# Patient Record
Sex: Male | Born: 1945 | Hispanic: No | Marital: Single
Health system: Southern US, Community
[De-identification: ages and names within clinical notes are randomized; demographics above are authoritative.]

---

## 2017-10-30 DIAGNOSIS — C160 Malignant neoplasm of cardia: Secondary | ICD-10-CM

## 2017-10-30 DIAGNOSIS — F17210 Nicotine dependence, cigarettes, uncomplicated: Secondary | ICD-10-CM

## 2017-10-30 DIAGNOSIS — Z7289 Other problems related to lifestyle: Secondary | ICD-10-CM | POA: Diagnosis not present

## 2017-10-30 DIAGNOSIS — R634 Abnormal weight loss: Secondary | ICD-10-CM | POA: Diagnosis not present

## 2017-11-03 DIAGNOSIS — R918 Other nonspecific abnormal finding of lung field: Secondary | ICD-10-CM | POA: Diagnosis not present

## 2017-11-03 DIAGNOSIS — C160 Malignant neoplasm of cardia: Secondary | ICD-10-CM

## 2017-11-03 DIAGNOSIS — F17210 Nicotine dependence, cigarettes, uncomplicated: Secondary | ICD-10-CM | POA: Diagnosis not present

## 2017-11-20 DIAGNOSIS — C160 Malignant neoplasm of cardia: Secondary | ICD-10-CM | POA: Diagnosis not present

## 2017-12-25 DIAGNOSIS — C160 Malignant neoplasm of cardia: Secondary | ICD-10-CM | POA: Diagnosis not present

## 2018-01-08 DIAGNOSIS — C160 Malignant neoplasm of cardia: Secondary | ICD-10-CM | POA: Diagnosis not present

## 2018-01-22 DIAGNOSIS — R05 Cough: Secondary | ICD-10-CM

## 2018-01-22 DIAGNOSIS — C160 Malignant neoplasm of cardia: Secondary | ICD-10-CM

## 2018-02-05 DIAGNOSIS — R05 Cough: Secondary | ICD-10-CM

## 2018-02-05 DIAGNOSIS — C160 Malignant neoplasm of cardia: Secondary | ICD-10-CM

## 2018-02-19 DIAGNOSIS — C160 Malignant neoplasm of cardia: Secondary | ICD-10-CM

## 2018-02-19 DIAGNOSIS — E876 Hypokalemia: Secondary | ICD-10-CM

## 2018-02-19 DIAGNOSIS — D701 Agranulocytosis secondary to cancer chemotherapy: Secondary | ICD-10-CM

## 2018-02-19 DIAGNOSIS — R05 Cough: Secondary | ICD-10-CM | POA: Diagnosis not present

## 2018-03-18 DIAGNOSIS — S72002A Fracture of unspecified part of neck of left femur, initial encounter for closed fracture: Secondary | ICD-10-CM

## 2018-03-18 DIAGNOSIS — W19XXXA Unspecified fall, initial encounter: Secondary | ICD-10-CM

## 2018-03-19 DIAGNOSIS — W19XXXA Unspecified fall, initial encounter: Secondary | ICD-10-CM | POA: Diagnosis not present

## 2018-03-19 DIAGNOSIS — S72002A Fracture of unspecified part of neck of left femur, initial encounter for closed fracture: Secondary | ICD-10-CM | POA: Diagnosis not present

## 2018-03-20 DIAGNOSIS — S72002A Fracture of unspecified part of neck of left femur, initial encounter for closed fracture: Secondary | ICD-10-CM | POA: Diagnosis not present

## 2018-03-20 DIAGNOSIS — W19XXXA Unspecified fall, initial encounter: Secondary | ICD-10-CM | POA: Diagnosis not present

## 2018-03-21 DIAGNOSIS — W19XXXA Unspecified fall, initial encounter: Secondary | ICD-10-CM | POA: Diagnosis not present

## 2018-03-21 DIAGNOSIS — S72002A Fracture of unspecified part of neck of left femur, initial encounter for closed fracture: Secondary | ICD-10-CM | POA: Diagnosis not present

## 2018-03-22 DIAGNOSIS — S72002A Fracture of unspecified part of neck of left femur, initial encounter for closed fracture: Secondary | ICD-10-CM | POA: Diagnosis not present

## 2018-03-22 DIAGNOSIS — W19XXXA Unspecified fall, initial encounter: Secondary | ICD-10-CM | POA: Diagnosis not present

## 2018-03-24 ENCOUNTER — Inpatient Hospital Stay (HOSPITAL_COMMUNITY): Payer: Medicare Other

## 2018-03-24 ENCOUNTER — Inpatient Hospital Stay (HOSPITAL_COMMUNITY)
Admission: AD | Admit: 2018-03-24 | Discharge: 2018-03-28 | DRG: 208 | Disposition: E | Payer: Medicare Other | Source: Other Acute Inpatient Hospital | Attending: Internal Medicine | Admitting: Internal Medicine

## 2018-03-24 DIAGNOSIS — Z681 Body mass index (BMI) 19 or less, adult: Secondary | ICD-10-CM | POA: Diagnosis not present

## 2018-03-24 DIAGNOSIS — Z9289 Personal history of other medical treatment: Secondary | ICD-10-CM

## 2018-03-24 DIAGNOSIS — Z9119 Patient's noncompliance with other medical treatment and regimen: Secondary | ICD-10-CM

## 2018-03-24 DIAGNOSIS — Z79899 Other long term (current) drug therapy: Secondary | ICD-10-CM | POA: Diagnosis not present

## 2018-03-24 DIAGNOSIS — J150 Pneumonia due to Klebsiella pneumoniae: Secondary | ICD-10-CM

## 2018-03-24 DIAGNOSIS — F17200 Nicotine dependence, unspecified, uncomplicated: Secondary | ICD-10-CM | POA: Diagnosis not present

## 2018-03-24 DIAGNOSIS — J9601 Acute respiratory failure with hypoxia: Secondary | ICD-10-CM | POA: Diagnosis present

## 2018-03-24 DIAGNOSIS — B441 Other pulmonary aspergillosis: Secondary | ICD-10-CM | POA: Diagnosis present

## 2018-03-24 DIAGNOSIS — R0603 Acute respiratory distress: Secondary | ICD-10-CM | POA: Diagnosis not present

## 2018-03-24 DIAGNOSIS — G9340 Encephalopathy, unspecified: Secondary | ICD-10-CM | POA: Diagnosis present

## 2018-03-24 DIAGNOSIS — I272 Pulmonary hypertension, unspecified: Secondary | ICD-10-CM | POA: Diagnosis present

## 2018-03-24 DIAGNOSIS — Z978 Presence of other specified devices: Secondary | ICD-10-CM

## 2018-03-24 DIAGNOSIS — J9600 Acute respiratory failure, unspecified whether with hypoxia or hypercapnia: Secondary | ICD-10-CM | POA: Diagnosis present

## 2018-03-24 DIAGNOSIS — Z833 Family history of diabetes mellitus: Secondary | ICD-10-CM | POA: Diagnosis not present

## 2018-03-24 DIAGNOSIS — R627 Adult failure to thrive: Secondary | ICD-10-CM | POA: Diagnosis present

## 2018-03-24 DIAGNOSIS — W19XXXD Unspecified fall, subsequent encounter: Secondary | ICD-10-CM | POA: Diagnosis present

## 2018-03-24 DIAGNOSIS — R34 Anuria and oliguria: Secondary | ICD-10-CM | POA: Diagnosis present

## 2018-03-24 DIAGNOSIS — E43 Unspecified severe protein-calorie malnutrition: Secondary | ICD-10-CM | POA: Diagnosis present

## 2018-03-24 DIAGNOSIS — B20 Human immunodeficiency virus [HIV] disease: Secondary | ICD-10-CM | POA: Diagnosis present

## 2018-03-24 DIAGNOSIS — R64 Cachexia: Secondary | ICD-10-CM | POA: Diagnosis present

## 2018-03-24 DIAGNOSIS — Z9911 Dependence on respirator [ventilator] status: Secondary | ICD-10-CM | POA: Diagnosis not present

## 2018-03-24 DIAGNOSIS — Z7189 Other specified counseling: Secondary | ICD-10-CM | POA: Diagnosis not present

## 2018-03-24 DIAGNOSIS — J449 Chronic obstructive pulmonary disease, unspecified: Secondary | ICD-10-CM | POA: Diagnosis present

## 2018-03-24 DIAGNOSIS — Z9221 Personal history of antineoplastic chemotherapy: Secondary | ICD-10-CM

## 2018-03-24 DIAGNOSIS — Z9981 Dependence on supplemental oxygen: Secondary | ICD-10-CM | POA: Diagnosis not present

## 2018-03-24 DIAGNOSIS — E875 Hyperkalemia: Secondary | ICD-10-CM | POA: Diagnosis present

## 2018-03-24 DIAGNOSIS — A419 Sepsis, unspecified organism: Secondary | ICD-10-CM | POA: Diagnosis not present

## 2018-03-24 DIAGNOSIS — K0889 Other specified disorders of teeth and supporting structures: Secondary | ICD-10-CM | POA: Diagnosis not present

## 2018-03-24 DIAGNOSIS — C169 Malignant neoplasm of stomach, unspecified: Secondary | ICD-10-CM | POA: Diagnosis present

## 2018-03-24 DIAGNOSIS — Z9114 Patient's other noncompliance with medication regimen: Secondary | ICD-10-CM | POA: Diagnosis not present

## 2018-03-24 DIAGNOSIS — R6521 Severe sepsis with septic shock: Secondary | ICD-10-CM | POA: Diagnosis not present

## 2018-03-24 DIAGNOSIS — I959 Hypotension, unspecified: Secondary | ICD-10-CM | POA: Diagnosis not present

## 2018-03-24 DIAGNOSIS — D72829 Elevated white blood cell count, unspecified: Secondary | ICD-10-CM

## 2018-03-24 DIAGNOSIS — Z515 Encounter for palliative care: Secondary | ICD-10-CM | POA: Diagnosis not present

## 2018-03-24 DIAGNOSIS — S72002D Fracture of unspecified part of neck of left femur, subsequent encounter for closed fracture with routine healing: Secondary | ICD-10-CM

## 2018-03-24 DIAGNOSIS — X58XXXD Exposure to other specified factors, subsequent encounter: Secondary | ICD-10-CM | POA: Diagnosis not present

## 2018-03-24 DIAGNOSIS — N179 Acute kidney failure, unspecified: Secondary | ICD-10-CM | POA: Diagnosis not present

## 2018-03-24 DIAGNOSIS — R579 Shock, unspecified: Secondary | ICD-10-CM | POA: Diagnosis not present

## 2018-03-24 LAB — RESPIRATORY PANEL BY PCR
Adenovirus: NOT DETECTED
Bordetella pertussis: NOT DETECTED
CHLAMYDOPHILA PNEUMONIAE-RVPPCR: NOT DETECTED
CORONAVIRUS NL63-RVPPCR: NOT DETECTED
Coronavirus 229E: NOT DETECTED
Coronavirus HKU1: NOT DETECTED
Coronavirus OC43: NOT DETECTED
Influenza A: NOT DETECTED
Influenza B: NOT DETECTED
Metapneumovirus: NOT DETECTED
Mycoplasma pneumoniae: NOT DETECTED
PARAINFLUENZA VIRUS 3-RVPPCR: NOT DETECTED
Parainfluenza Virus 1: NOT DETECTED
Parainfluenza Virus 2: NOT DETECTED
Parainfluenza Virus 4: NOT DETECTED
Respiratory Syncytial Virus: NOT DETECTED
Rhinovirus / Enterovirus: NOT DETECTED

## 2018-03-24 LAB — COMPREHENSIVE METABOLIC PANEL
ALT: 24 U/L (ref 0–44)
AST: 38 U/L (ref 15–41)
Albumin: 2.5 g/dL — ABNORMAL LOW (ref 3.5–5.0)
Alkaline Phosphatase: 123 U/L (ref 38–126)
Anion gap: 9 (ref 5–15)
BUN: 60 mg/dL — ABNORMAL HIGH (ref 8–23)
CHLORIDE: 102 mmol/L (ref 98–111)
CO2: 28 mmol/L (ref 22–32)
Calcium: 10.2 mg/dL (ref 8.9–10.3)
Creatinine, Ser: 1.4 mg/dL — ABNORMAL HIGH (ref 0.61–1.24)
GFR calc Af Amer: 58 mL/min — ABNORMAL LOW (ref >60)
GFR calc non Af Amer: 50 mL/min — ABNORMAL LOW (ref >60)
Glucose, Bld: 189 mg/dL — ABNORMAL HIGH (ref 70–99)
Potassium: 6.2 mmol/L — ABNORMAL HIGH (ref 3.5–5.1)
Sodium: 139 mmol/L (ref 135–145)
Total Bilirubin: 1.4 mg/dL — ABNORMAL HIGH (ref 0.3–1.2)
Total Protein: 5.8 g/dL — ABNORMAL LOW (ref 6.5–8.1)

## 2018-03-24 LAB — CORTISOL: Cortisol, Plasma: 36.6 ug/dL

## 2018-03-24 LAB — URINALYSIS, ROUTINE W REFLEX MICROSCOPIC
Bilirubin Urine: NEGATIVE
Glucose, UA: NEGATIVE mg/dL
Ketones, ur: NEGATIVE mg/dL
Leukocytes, UA: NEGATIVE
Nitrite: NEGATIVE
PROTEIN: 30 mg/dL — AB
Specific Gravity, Urine: 1.033 — ABNORMAL HIGH (ref 1.005–1.030)
pH: 6 (ref 5.0–8.0)

## 2018-03-24 LAB — GLUCOSE, CAPILLARY
GLUCOSE-CAPILLARY: 164 mg/dL — AB (ref 70–99)
Glucose-Capillary: 166 mg/dL — ABNORMAL HIGH (ref 70–99)
Glucose-Capillary: 145 mg/dL — ABNORMAL HIGH (ref 70–99)

## 2018-03-24 LAB — CBC WITH DIFFERENTIAL/PLATELET
Abs Immature Granulocytes: 0.72 10*3/uL — ABNORMAL HIGH (ref 0.00–0.07)
BASOS ABS: 0.1 10*3/uL (ref 0.0–0.1)
Basophils Relative: 0 %
Eosinophils Absolute: 0 10*3/uL (ref 0.0–0.5)
Eosinophils Relative: 0 %
HCT: 33.9 % — ABNORMAL LOW (ref 39.0–52.0)
Hemoglobin: 10.3 g/dL — ABNORMAL LOW (ref 13.0–17.0)
Immature Granulocytes: 2 %
Lymphocytes Relative: 0 %
Lymphs Abs: 0 10*3/uL — ABNORMAL LOW (ref 0.7–4.0)
MCH: 33.7 pg (ref 26.0–34.0)
MCHC: 30.4 g/dL (ref 30.0–36.0)
MCV: 110.8 fL — ABNORMAL HIGH (ref 80.0–100.0)
Monocytes Absolute: 1.2 10*3/uL — ABNORMAL HIGH (ref 0.1–1.0)
Monocytes Relative: 3 %
NRBC: 0 % (ref 0.0–0.2)
Neutro Abs: 44.1 10*3/uL — ABNORMAL HIGH (ref 1.7–7.7)
Neutrophils Relative %: 95 %
Platelets: 291 10*3/uL (ref 150–400)
RBC: 3.06 MIL/uL — ABNORMAL LOW (ref 4.22–5.81)
RDW: 15.9 % — ABNORMAL HIGH (ref 11.5–15.5)
WBC MORPHOLOGY: INCREASED
WBC: 46.1 10*3/uL — ABNORMAL HIGH (ref 4.0–10.5)

## 2018-03-24 LAB — TRIGLYCERIDES: Triglycerides: 85 mg/dL (ref <150)

## 2018-03-24 LAB — POCT I-STAT 3, ART BLOOD GAS (G3+)
Acid-Base Excess: 4 mmol/L — ABNORMAL HIGH (ref 0.0–2.0)
Bicarbonate: 28.6 mmol/L — ABNORMAL HIGH (ref 20.0–28.0)
O2 Saturation: 90 %
Patient temperature: 98.6
TCO2: 30 mmol/L (ref 22–32)
pCO2 arterial: 43.2 mmHg (ref 32.0–48.0)
pH, Arterial: 7.43 (ref 7.350–7.450)
pO2, Arterial: 57 mmHg — ABNORMAL LOW (ref 83.0–108.0)

## 2018-03-24 LAB — APTT: aPTT: 25 seconds (ref 24–36)

## 2018-03-24 LAB — PROCALCITONIN: Procalcitonin: 31.18 ng/mL

## 2018-03-24 LAB — PHOSPHORUS: PHOSPHORUS: 4.9 mg/dL — AB (ref 2.5–4.6)

## 2018-03-24 LAB — LIPASE, BLOOD: Lipase: 20 U/L (ref 11–51)

## 2018-03-24 LAB — PROTIME-INR
INR: 0.94
Prothrombin Time: 12.5 seconds (ref 11.4–15.2)

## 2018-03-24 LAB — MRSA PCR SCREENING: MRSA by PCR: NEGATIVE

## 2018-03-24 LAB — BRAIN NATRIURETIC PEPTIDE: B Natriuretic Peptide: 400.5 pg/mL — ABNORMAL HIGH (ref 0.0–100.0)

## 2018-03-24 LAB — AMYLASE: Amylase: 138 U/L — ABNORMAL HIGH (ref 28–100)

## 2018-03-24 LAB — MAGNESIUM: Magnesium: 3.8 mg/dL — ABNORMAL HIGH (ref 1.7–2.4)

## 2018-03-24 LAB — TROPONIN I: TROPONIN I: 0.09 ng/mL — AB (ref <0.03)

## 2018-03-24 LAB — LACTIC ACID, PLASMA
Lactic Acid, Venous: 2.4 mmol/L (ref 0.5–1.9)
Lactic Acid, Venous: 2.7 mmol/L (ref 0.5–1.9)

## 2018-03-24 MED ORDER — INSULIN ASPART 100 UNIT/ML ~~LOC~~ SOLN
2.0000 [IU] | SUBCUTANEOUS | Status: DC
  Administered 2018-03-24: 2 [IU] via SUBCUTANEOUS
  Administered 2018-03-24: 4 [IU] via SUBCUTANEOUS
  Administered 2018-03-25: 2 [IU] via SUBCUTANEOUS

## 2018-03-24 MED ORDER — VANCOMYCIN HCL IN DEXTROSE 750-5 MG/150ML-% IV SOLN: 750.0000 mg | INTRAVENOUS | Status: DC

## 2018-03-24 MED ORDER — PIPERACILLIN-TAZOBACTAM 3.375 G IVPB
3.3750 g | Freq: Three times a day (TID) | INTRAVENOUS | Status: DC
  Administered 2018-03-25: 3.375 g via INTRAVENOUS
  Filled 2018-03-24 (×4): qty 50

## 2018-03-24 MED ORDER — PROPOFOL 1000 MG/100ML IV EMUL
0.0000 ug/kg/min | INTRAVENOUS | Status: DC
  Administered 2018-03-24: 5 ug/kg/min via INTRAVENOUS
  Administered 2018-03-25: 15 ug/kg/min via INTRAVENOUS
  Filled 2018-03-24 (×2): qty 100

## 2018-03-24 MED ORDER — MIDAZOLAM HCL 2 MG/2ML IJ SOLN: 2.0000 mg | INTRAMUSCULAR | Status: DC | PRN

## 2018-03-24 MED ORDER — FENTANYL CITRATE (PF) 100 MCG/2ML IJ SOLN
100.0000 ug | INTRAMUSCULAR | Status: DC | PRN
  Administered 2018-03-25: 25 ug via INTRAVENOUS
  Filled 2018-03-24 (×2): qty 2

## 2018-03-24 MED ORDER — DEXTROSE 50 % IV SOLN
1.0000 | Freq: Once | INTRAVENOUS | Status: AC
  Administered 2018-03-24: 50 mL via INTRAVENOUS
  Filled 2018-03-24: qty 50

## 2018-03-24 MED ORDER — VANCOMYCIN HCL IN DEXTROSE 1-5 GM/200ML-% IV SOLN
1000.0000 mg | Freq: Once | INTRAVENOUS | Status: AC
  Administered 2018-03-25: 1000 mg via INTRAVENOUS
  Filled 2018-03-24: qty 200

## 2018-03-24 MED ORDER — ORAL CARE MOUTH RINSE
15.0000 mL | OROMUCOSAL | Status: DC
  Administered 2018-03-24 – 2018-03-25 (×7): 15 mL via OROMUCOSAL

## 2018-03-24 MED ORDER — INSULIN ASPART 100 UNIT/ML IV SOLN
10.0000 [IU] | Freq: Once | INTRAVENOUS | Status: AC
  Administered 2018-03-24: 10 [IU] via INTRAVENOUS

## 2018-03-24 MED ORDER — DEXTROSE IN LACTATED RINGERS 5 % IV SOLN
INTRAVENOUS | Status: DC
  Administered 2018-03-24 – 2018-03-25 (×2): via INTRAVENOUS

## 2018-03-24 MED ORDER — PIPERACILLIN-TAZOBACTAM 3.375 G IVPB 30 MIN
3.3750 g | Freq: Once | INTRAVENOUS | Status: AC
  Administered 2018-03-25: 3.375 g via INTRAVENOUS
  Filled 2018-03-24: qty 50

## 2018-03-24 MED ORDER — VORICONAZOLE 200 MG IV SOLR
4.0000 mg/kg | Freq: Two times a day (BID) | INTRAVENOUS | Status: DC
  Administered 2018-03-25 (×2): 210 mg via INTRAVENOUS
  Filled 2018-03-24 (×3): qty 210

## 2018-03-24 MED ORDER — SODIUM CHLORIDE 0.9 % IV BOLUS
1000.0000 mL | Freq: Once | INTRAVENOUS | Status: AC
  Administered 2018-03-24: 1000 mL via INTRAVENOUS

## 2018-03-24 MED ORDER — VORICONAZOLE 200 MG IV SOLR
6.0000 mg/kg | Freq: Once | INTRAVENOUS | Status: DC
  Filled 2018-03-24: qty 320

## 2018-03-24 MED ORDER — CHLORHEXIDINE GLUCONATE 0.12% ORAL RINSE (MEDLINE KIT)
15.0000 mL | Freq: Two times a day (BID) | OROMUCOSAL | Status: DC
  Administered 2018-03-24 – 2018-03-25 (×2): 15 mL via OROMUCOSAL

## 2018-03-24 MED ORDER — FENTANYL CITRATE (PF) 100 MCG/2ML IJ SOLN
100.0000 ug | INTRAMUSCULAR | Status: DC | PRN
  Administered 2018-03-25: 100 ug via INTRAVENOUS

## 2018-03-24 MED ORDER — SODIUM POLYSTYRENE SULFONATE 15 GM/60ML PO SUSP
30.0000 g | Freq: Once | ORAL | Status: AC
  Administered 2018-03-24: 30 g
  Filled 2018-03-24: qty 120

## 2018-03-24 MED ORDER — IPRATROPIUM-ALBUTEROL 0.5-2.5 (3) MG/3ML IN SOLN
3.0000 mL | Freq: Four times a day (QID) | RESPIRATORY_TRACT | Status: DC
  Administered 2018-03-24 – 2018-03-25 (×5): 3 mL via RESPIRATORY_TRACT
  Filled 2018-03-24 (×6): qty 3

## 2018-03-24 MED ORDER — MIDAZOLAM HCL 2 MG/2ML IJ SOLN
2.0000 mg | INTRAMUSCULAR | Status: DC | PRN
  Administered 2018-03-25: 2 mg via INTRAVENOUS
  Filled 2018-03-24: qty 2

## 2018-03-24 NOTE — Progress Notes (Signed)
Pharmacy Antibiotic Note  Mario Harding is a 72 y.o. male admitted on 03/11/2018 with sepsis.  Pharmacy has been consulted for vancomycin, zosyn and voriconazole dosing. He was on oral voriconazole PTA.   Plan: Vancomycin 1gm x 1 then 750 mg IV q36 hours Zosyn 3.375 gm IV q8 hours Voriconazole 4mg /kg IV q12 hours, change to po when possible as accumulation of  IV vehicle can occur F/u renal function ,cultures and clinical course  Height: 5' 8.5" (174 cm) Weight: 115 lb 15.4 oz (52.6 kg) IBW/kg (Calculated) : 69.55  Temp (24hrs), Avg:98 F (36.7 C), Min:97.5 F (36.4 C), Max:98.6 F (37 C)  Recent Labs  Lab 03/14/2018 1620 02/28/2018 1853  WBC 46.1*  --   CREATININE 1.40*  --   LATICACIDVEN 2.4* 2.7*    Estimated Creatinine Clearance: 35.5 mL/min (A) (by C-G formula based on SCr of 1.4 mg/dL (H)).    Allergies not on file   Thank you for allowing pharmacy to be a part of this patient's care.  Excell Seltzer Poteet 02/26/2018 11:48 PM

## 2018-03-24 NOTE — Progress Notes (Signed)
CRITICAL VALUE ALERT  Critical Value:  Troponin 0.04 and Lactic acid: 2.4  Date & Time Notied:  03/05/2018  Provider Notified: Dr. Chase Caller  Orders Received/Actions taken: Orders to be placed by MD.

## 2018-03-24 NOTE — H&P (Signed)
NAME:  Mario Harding, MRN:  [id number], DOB:  [birth year], LOS: 0 ADMISSION DATE:  03/13/2018, CONSULTATION DATE:  03/06/2018 REFERRING MD:  Tia Alert ER, CHIEF COMPLAINT:  resp failure  PCP Raelyn Number, MD Primary Pulmnoary: Dr Alcide Clever  Brief History   See history of present illness  History of present illness   History is provided by the daughter and review of the [facility] health records  72 year old male who gets his care at the [place] and at [place].  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 Bobby Rumpf for which he takes chemotherapy every few weeks at [facility].  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  According to the daughter for the last 2 weeks he has had on and off confusion NOS and then on March 18, 2018 fell down and sustained a left hip fracture for which he was operated on March 19, 2018 [ORIF per nursing history] and then approximately March 23, 2018 was sent to skilled nursing facility where confusion persisted and he could not participate in rehab.  Then on the night of 23 March 2018 he developed hypoxemia and worsening confusion and admitted to [facility] emergency department March 24, 2018 with a CT angiogram of the chest ruled out pulmonary embolism or acute aortic syndrome.  However he had new filling of the right lower lobe segmental bronchi likely due to mucous plugging versus endobronchial tumor.  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  He was intubated and then transferred to [facility] 03/26/2018   Lab review March 16, 2018 at [place]: Elevated white count of 43,700 mostly neutrophils, elevated d-dimer at 3000, ABG 7.35/50 5/207, chemistry profile showing creatinine of 1.1 but with corrected calcium with hypercalcemia of 12.3, hyperkalemia 5.5 and elevated BNP at 3250 but a lactic acid that was normal and  troponin normal.   Past Medical History    has no past medical history on file.   has no history on file for tobacco.   The histories are not reviewed yet. Please review them in the "History" navigator section and refresh this [place].  Allergies not on file   There is no immunization history on file for this patient.  No family history on file.   Current Facility-Administered Medications:  .  dextrose 5 % in lactated ringers infusion, , Intravenous, Continuous, Mario Lovelady, MD, Last Rate: 50 mL/hr at 03/11/2018 1700 .  fentaNYL (SUBLIMAZE) injection 100 mcg, 100 mcg, Intravenous, Q15 min PRN, Mario Harding, Mario Kraai, MD .  fentaNYL (SUBLIMAZE) injection 100 mcg, 100 mcg, Intravenous, Q2H PRN, Mario Hendricksen, MD .  insulin aspart (novoLOG) injection 2-6 Units, 2-6 Units, Subcutaneous, Q4H, Mario Males, MD, 4 Units at 03/01/2018 1657 .  ipratropium-albuterol (DUONEB) 0.5-2.5 (3) MG/3ML nebulizer solution 3 mL, 3 mL, Nebulization, Q6H, Mario Vallez, MD, 3 mL at 03/11/2018 1605 .  midazolam (VERSED) injection 2 mg, 2 mg, Intravenous, Q15 min PRN, Mario Males, MD .  midazolam (VERSED) injection 2 mg, 2 mg, Intravenous, Q2H PRN, Mario Males, MD .  propofol (DIPRIVAN) 1000 MG/100ML infusion, 0-50 mcg/kg/min, Intravenous, Continuous, Mario Jane, MD, Last Rate: 1.58 mL/hr at 02/26/2018 1702, 5 mcg/kg/min at 03/18/2018 [place] [facility] Events   03/18/2018 - admit [place]  Consults:    Procedures:  Chronic -  portocath 03/08/2018 - ET tube  Significant Diagnostic Tests:  x  Micro Data:  Blood culture   Antimicrobials:  Vanc Zosyn  Interim history/subjective:  x  Objective   Pulse (!) 106, temperature 97.8 F (36.6 C), temperature source Axillary, resp. rate 18, SpO2 100 %.    FiO2 (%):  [40 %] 40 %  No intake or output data in the 24 hours ending 02/26/2018 1550 There were no vitals filed for this visit. General Appearance:  Looks criticall ill EMACIATED Head:  Normocephalic, without obvious abnormality, atraumatic. BEARD + Eyes:  PERRL - yes, conjunctiva/corneas - muddy     Ears:  Normal external ear canals, both ears Nose:  G tube - no Throat:  ETT TUBE - yes , OG tube - yes Neck:  Supple,  No enlargement/tenderness/nodules Lungs: Clear to auscultation bilaterally, Ventilator   Synchrony - yes . PORTOCATH  RT CHEST + Heart:  S1 and S2 normal, no murmur, CVP - x.  Pressors - no Abdomen:  Soft, no masses, no organomegaly Genitalia / Rectal:  Not done Extremities:  Extremities- intact . L:eft hip  Skin:  ntact in exposed areas . Sacral area - not examined Neurologic:  Sedation - was on fent gtt/versed gtt , now diprivian gtt -> RASS - -3      LABS    PULMONARY No results for input(s): PHART, PCO2ART, PO2ART, HCO3, TCO2, O2SAT in the last 168 hours.  Invalid input(s): PCO2, PO2  CBC No results for input(s): HGB, HCT, WBC, PLT in the last 168 hours.  COAGULATION No results for input(s): INR in the last 168 hours.  CARDIAC  No results for input(s): TROPONINI in the last 168 hours. No results for input(s): PROBNP in the last 168 hours.   CHEMISTRY No results for input(s): NA, K, CL, CO2, GLUCOSE, BUN, CREATININE, CALCIUM, MG, PHOS in the last 168 hours. CrCl cannot be calculated (No successful lab value found.).   LIVER No results for input(s): AST, ALT, ALKPHOS, BILITOT, PROT, ALBUMIN, INR in the last 168 hours.   INFECTIOUS No results for input(s):  LATICACIDVEN, PROCALCITON in the last 168 hours.   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 Maintains BP/HR  P:  Rule out MI Check echo   RENAL A:  At risk for AKI - normal at [place] 03/18/2018  P:  Monitor D5 LR maintenance  ELECTROLYTES A:  Hypercalcemia at [facility] - ?  details P: Recheck Might need pamidronate   GASTROINTESTINAL A:   Gastric Cancer Stage 1  hx on chemo x 1 year  - details in [place] Non specific abd symptoms x few days prior to admit  P:   OG tube ABd Xray Might need CT abd  HEMATOLOGIC A:  Super high leukocytsis -at [place] - ? Duration, ? baseline   P:  - recheck at [facility] - PRBC for hgb </= 6.9gm%    - exceptions are   -  if ACS susepcted/confirmed then transfuse for hgb </= 8.0gm%,  or    -  active bleeding with hemodynamic instability, then transfuse regardless of hemoglobin value   At at all times try to transfuse 1 unit prbc as possible with exception of active hemorrhage    INFECTIOUS A:   HIV on descovy - per outside record RUL Aspergilloma  On vori per outside record Currently pulmonary infiltrates  P:   chck for CAP Check for sepsis Check HIv panel and CD4 Likely needs ID consult 04/04/2018  Might need bronch rule out PCP    ENDOCRINE A:   At risk hyperglycemui   P:   ssi  NEUROLOGIC A:   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  Copd related , ? Sepsis related, ? HIV related P:   CT head MRI might be needed  GLOBAL Multiple complex issue   Best practice:  Diet: NPO Pain/Anxiety/Delirium protocol (if indicated): diprivan VAP protocol (if indicated): yes DVT prophylaxis: scd for now GI prophylaxis: pepcid Glucose control: ssi Mobility: bed rest Code Status: full code per daughter Family Communication: daughter at bedside updated Disposition:  ICU   ATTESTATION & SIGNATURE   The patient Shafter Jupin is critically ill with multiple organ systems failure and requires high complexity decision making for assessment and support, frequent evaluation and titration of therapies, application of advanced monitoring technologies and extensive interpretation of multiple databases.   Critical Care Time devoted to patient care services described in this note is  75  Minutes. This time reflects time of care of this signee Dr Mario Harding. This critical care time does not reflect procedure time, or teaching time or supervisory time of PA/NP/Med student/Med Resident etc but could involve care discussion time     Dr. Brand Harding, M.D., [facility].C.P Pulmonary and Critical Care Medicine Staff Physician [place] [facility] and Critical [facility] Pager: [phone], If no answer or between  15:00h - 7:00h: call [phone]  03/20/2018 3:50 PM

## 2018-03-24 NOTE — Progress Notes (Signed)
eLink Physician-Brief Progress Note Patient Name: Mario Harding DOB: 1945-04-07 MRN: [id number]   Date of Service  03/05/2018  HPI/Events of Note  Patient with history of HIV and aspergillosis of RUL. Was on Voriconazole as outpatient. Admitted with WBC = 46.1 and PCT = 31.18. Patient is NOT on any antibiotic coverage at this time.   eICU Interventions  Will order: 1. Voriconazole, Vancomycin and Zosyn per pharmacy consult.      Intervention Category Major Interventions: Infection - evaluation and management  Caitlynne Harbeck Eugene 03/12/2018, 11:17 PM

## 2018-03-24 NOTE — Progress Notes (Signed)
Versed 18ml and Fentanyl 294ml wasted in sink with Lucile Crater, RN at this time.

## 2018-03-24 NOTE — Progress Notes (Addendum)
eLink Physician-Brief Progress Note Patient Name: Mario Harding DOB: 1945/05/26 MRN: [id number]   Date of Service  03/22/2018  HPI/Events of Note  Multiple issues: Hyperkalemia - K+ = 6.2 at 4:20 PM and not addressed. QRS not widened or T wave peaked on bedside monitor and 2. BP soft - BP = 79/58 with MAP = 66.  eICU Interventions  Will order: 1. D50 1 amp IV and Insulin 10 units IV now 2. Kayexalate 30 gm per tube now.  3. Repeat BMP at 5 AM. 4. Will bolus with 0.9 NaCl 1 liter IV over 1 hour now.      Intervention Category Major Interventions: Electrolyte abnormality - evaluation and management  Sommer,Steven Eugene 03/24/2018, 9:18 PM

## 2018-03-25 ENCOUNTER — Inpatient Hospital Stay (HOSPITAL_COMMUNITY): Payer: Medicare Other

## 2018-03-25 DIAGNOSIS — B441 Other pulmonary aspergillosis: Secondary | ICD-10-CM

## 2018-03-25 DIAGNOSIS — N179 Acute kidney failure, unspecified: Secondary | ICD-10-CM

## 2018-03-25 DIAGNOSIS — J150 Pneumonia due to Klebsiella pneumoniae: Secondary | ICD-10-CM

## 2018-03-25 DIAGNOSIS — D72829 Elevated white blood cell count, unspecified: Secondary | ICD-10-CM

## 2018-03-25 DIAGNOSIS — S72002D Fracture of unspecified part of neck of left femur, subsequent encounter for closed fracture with routine healing: Secondary | ICD-10-CM

## 2018-03-25 DIAGNOSIS — G9340 Encephalopathy, unspecified: Secondary | ICD-10-CM

## 2018-03-25 DIAGNOSIS — A419 Sepsis, unspecified organism: Secondary | ICD-10-CM

## 2018-03-25 DIAGNOSIS — R579 Shock, unspecified: Secondary | ICD-10-CM

## 2018-03-25 DIAGNOSIS — X58XXXD Exposure to other specified factors, subsequent encounter: Secondary | ICD-10-CM

## 2018-03-25 DIAGNOSIS — Z7189 Other specified counseling: Secondary | ICD-10-CM

## 2018-03-25 DIAGNOSIS — R0603 Acute respiratory distress: Secondary | ICD-10-CM

## 2018-03-25 DIAGNOSIS — Z515 Encounter for palliative care: Secondary | ICD-10-CM

## 2018-03-25 DIAGNOSIS — C169 Malignant neoplasm of stomach, unspecified: Secondary | ICD-10-CM

## 2018-03-25 DIAGNOSIS — B20 Human immunodeficiency virus [HIV] disease: Secondary | ICD-10-CM

## 2018-03-25 DIAGNOSIS — Z9911 Dependence on respirator [ventilator] status: Secondary | ICD-10-CM

## 2018-03-25 DIAGNOSIS — K0889 Other specified disorders of teeth and supporting structures: Secondary | ICD-10-CM

## 2018-03-25 DIAGNOSIS — R6521 Severe sepsis with septic shock: Secondary | ICD-10-CM

## 2018-03-25 LAB — POCT I-STAT 3, ART BLOOD GAS (G3+)
Acid-base deficit: 1 mmol/L (ref 0.0–2.0)
Bicarbonate: 24.4 mmol/L (ref 20.0–28.0)
O2 Saturation: 87 %
TCO2: 26 mmol/L (ref 22–32)
pCO2 arterial: 43 mmHg (ref 32.0–48.0)
pH, Arterial: 7.362 (ref 7.350–7.450)
pO2, Arterial: 55 mmHg — ABNORMAL LOW (ref 83.0–108.0)

## 2018-03-25 LAB — PHOSPHORUS
Phosphorus: 4.5 mg/dL (ref 2.5–4.6)
Phosphorus: 4.9 mg/dL — ABNORMAL HIGH (ref 2.5–4.6)

## 2018-03-25 LAB — BASIC METABOLIC PANEL
ANION GAP: 10 (ref 5–15)
BUN: 72 mg/dL — ABNORMAL HIGH (ref 8–23)
CO2: 24 mmol/L (ref 22–32)
Calcium: 9 mg/dL (ref 8.9–10.3)
Chloride: 105 mmol/L (ref 98–111)
Creatinine, Ser: 2.02 mg/dL — ABNORMAL HIGH (ref 0.61–1.24)
GFR calc Af Amer: 37 mL/min — ABNORMAL LOW (ref >60)
GFR calc non Af Amer: 32 mL/min — ABNORMAL LOW (ref >60)
Glucose, Bld: 123 mg/dL — ABNORMAL HIGH (ref 70–99)
Potassium: 6.1 mmol/L — ABNORMAL HIGH (ref 3.5–5.1)
Sodium: 139 mmol/L (ref 135–145)

## 2018-03-25 LAB — GLUCOSE, CAPILLARY
GLUCOSE-CAPILLARY: 115 mg/dL — AB (ref 70–99)
Glucose-Capillary: 128 mg/dL — ABNORMAL HIGH (ref 70–99)
Glucose-Capillary: 92 mg/dL (ref 70–99)
Glucose-Capillary: 141 mg/dL — ABNORMAL HIGH (ref 70–99)
Glucose-Capillary: 63 mg/dL — ABNORMAL LOW (ref 70–99)
Glucose-Capillary: 75 mg/dL (ref 70–99)

## 2018-03-25 LAB — CBC
HCT: 30.8 % — ABNORMAL LOW (ref 39.0–52.0)
Hemoglobin: 9.6 g/dL — ABNORMAL LOW (ref 13.0–17.0)
MCH: 34.9 pg — ABNORMAL HIGH (ref 26.0–34.0)
MCHC: 31.2 g/dL (ref 30.0–36.0)
MCV: 112 fL — ABNORMAL HIGH (ref 80.0–100.0)
NRBC: 0 % (ref 0.0–0.2)
Platelets: 237 10*3/uL (ref 150–400)
RBC: 2.75 MIL/uL — ABNORMAL LOW (ref 4.22–5.81)
RDW: 16.2 % — ABNORMAL HIGH (ref 11.5–15.5)
WBC: 17.9 10*3/uL — ABNORMAL HIGH (ref 4.0–10.5)

## 2018-03-25 LAB — URINE CULTURE: Culture: NO GROWTH

## 2018-03-25 LAB — TROPONIN I
Troponin I: 0.12 ng/mL (ref <0.03)
Troponin I: 0.14 ng/mL (ref <0.03)

## 2018-03-25 LAB — LEGIONELLA PNEUMOPHILA SEROGP 1 UR AG: L. pneumophila Serogp 1 Ur Ag: NEGATIVE

## 2018-03-25 LAB — PROCALCITONIN: Procalcitonin: 83.68 ng/mL

## 2018-03-25 LAB — MAGNESIUM
Magnesium: 4.2 mg/dL — ABNORMAL HIGH (ref 1.7–2.4)
Magnesium: 4.3 mg/dL — ABNORMAL HIGH (ref 1.7–2.4)

## 2018-03-25 LAB — LACTIC ACID, PLASMA
Lactic Acid, Venous: 2.6 mmol/L (ref 0.5–1.9)
Lactic Acid, Venous: 3 mmol/L (ref 0.5–1.9)

## 2018-03-25 MED ORDER — PHENYLEPHRINE HCL-NACL 10-0.9 MG/250ML-% IV SOLN
0.0000 ug/min | INTRAVENOUS | Status: DC
  Administered 2018-03-25: 400 ug/min via INTRAVENOUS
  Administered 2018-03-25: 160 ug/min via INTRAVENOUS
  Filled 2018-03-25 (×2): qty 250

## 2018-03-25 MED ORDER — PHENYLEPHRINE HCL-NACL 10-0.9 MG/250ML-% IV SOLN
0.0000 ug/min | INTRAVENOUS | Status: DC
  Administered 2018-03-25: 70 ug/min via INTRAVENOUS
  Administered 2018-03-25: 10 ug/min via INTRAVENOUS
  Administered 2018-03-25: 175 ug/min via INTRAVENOUS
  Administered 2018-03-25: 150 ug/min via INTRAVENOUS
  Filled 2018-03-25 (×5): qty 250

## 2018-03-25 MED ORDER — SODIUM CHLORIDE 0.9 % IV SOLN: INTRAVENOUS | Status: DC | PRN

## 2018-03-25 MED ORDER — SODIUM CHLORIDE 0.9 % IV BOLUS
1000.0000 mL | Freq: Once | INTRAVENOUS | Status: AC
  Administered 2018-03-25: 250 mL via INTRAVENOUS
  Administered 2018-03-25: 800 mL via INTRAVENOUS

## 2018-03-25 MED ORDER — PHENYLEPHRINE HCL-NACL 40-0.9 MG/250ML-% IV SOLN
0.0000 ug/min | INTRAVENOUS | Status: DC
  Filled 2018-03-25: qty 250

## 2018-03-25 MED ORDER — DEXTROSE 50 % IV SOLN
12.5000 g | INTRAVENOUS | Status: AC
  Administered 2018-03-25: 12.5 g via INTRAVENOUS
  Filled 2018-03-25: qty 50

## 2018-03-25 MED ORDER — SODIUM CHLORIDE 0.9 % IV SOLN
0.0000 ug/min | INTRAVENOUS | Status: DC
  Administered 2018-03-25: 170 ug/min via INTRAVENOUS
  Administered 2018-03-25: 400 ug/min via INTRAVENOUS
  Filled 2018-03-25 (×3): qty 4
  Filled 2018-03-25 (×2): qty 40

## 2018-03-25 MED ORDER — PRO-STAT SUGAR FREE PO LIQD: 30.0000 mL | Freq: Two times a day (BID) | ORAL | Status: DC

## 2018-03-25 MED ORDER — VITAL HIGH PROTEIN PO LIQD: 1000.0000 mL | ORAL | Status: DC

## 2018-03-25 MED ORDER — SODIUM CHLORIDE 0.9 % IV SOLN
INTRAVENOUS | Status: DC | PRN
  Administered 2018-03-25: 250 mL via INTRAVENOUS
  Administered 2018-03-25: 1000 mL via INTRAVENOUS

## 2018-03-26 LAB — T-HELPER CELLS (CD4) COUNT (NOT AT ARMC)
CD4 % Helper T Cell: 18 % — ABNORMAL LOW (ref 33–55)
CD4 T Cell Abs: 10 /uL — ABNORMAL LOW (ref 400–2700)

## 2018-03-26 LAB — HIV-1 RNA QUANT-NO REFLEX-BLD: LOG10 HIV-1 RNA: UNDETERMINED {Log_copies}/mL

## 2018-03-27 LAB — CULTURE, RESPIRATORY W GRAM STAIN: Gram Stain: NONE SEEN

## 2018-03-27 LAB — HIV 1/2 AB DIFFERENTIATION
HIV 1 Ab: POSITIVE — AB
HIV 2 Ab: NEGATIVE

## 2018-03-27 LAB — CULTURE, RESPIRATORY

## 2018-03-27 LAB — HIV ANTIBODY (ROUTINE TESTING W REFLEX): HIV Screen 4th Generation wRfx: REACTIVE — AB

## 2018-03-28 LAB — BLOOD CULTURE ID PANEL (REFLEXED)
Acinetobacter baumannii: NOT DETECTED
CANDIDA KRUSEI: NOT DETECTED
CANDIDA PARAPSILOSIS: NOT DETECTED
CARBAPENEM RESISTANCE: NOT DETECTED
Candida albicans: NOT DETECTED
Candida glabrata: NOT DETECTED
Candida tropicalis: NOT DETECTED
Enterobacter cloacae complex: NOT DETECTED
Enterobacteriaceae species: DETECTED — AB
Enterococcus species: NOT DETECTED
Escherichia coli: NOT DETECTED
HAEMOPHILUS INFLUENZAE: NOT DETECTED
KLEBSIELLA OXYTOCA: NOT DETECTED
Klebsiella pneumoniae: DETECTED — AB
Listeria monocytogenes: NOT DETECTED
Neisseria meningitidis: NOT DETECTED
Proteus species: NOT DETECTED
Pseudomonas aeruginosa: NOT DETECTED
STREPTOCOCCUS SPECIES: NOT DETECTED
Serratia marcescens: NOT DETECTED
Staphylococcus aureus (BCID): NOT DETECTED
Staphylococcus species: NOT DETECTED
Streptococcus agalactiae: NOT DETECTED
Streptococcus pneumoniae: NOT DETECTED
Streptococcus pyogenes: NOT DETECTED

## 2018-03-28 NOTE — Progress Notes (Signed)
CRITICAL VALUE ALERT  Critical Value: LA 3.0      Date & Time Notied:  04-21-2018 1145 am  Provider Notified: Dr. Nelda Marseille   Orders Received/Actions taken: No orders given will continue to monitor

## 2018-03-28 NOTE — Progress Notes (Signed)
NAME:  Mario Harding, MRN:  [id number], DOB:  1945/11/01, LOS: 1 ADMISSION DATE:  03/01/2018, CONSULTATION DATE:  03/15/2018 REFERRING MD:  Tia Alert ER, CHIEF COMPLAINT:  resp failure  PCP Raelyn Number, MD Primary Pulmnoary: Dr Alcide Clever  Brief History   See history of present illness  History of present illness   History is provided by the daughter and review of the [facility] health records  73 year old male who gets his care at the [place] and at [place].  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 Bobby Rumpf for which he takes chemotherapy every few weeks at [facility].  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  According to the daughter for the last 2 weeks he has had on and off confusion NOS and then on March 18, 2018 fell down and sustained a left hip fracture for which he was operated on March 19, 2018 [ORIF per nursing history] and then approximately March 23, 2018 was sent to skilled nursing facility where confusion persisted and he could not participate in rehab.  Then on the night of 23 March 2018 he developed hypoxemia and worsening confusion and admitted to [facility] emergency department March 24, 2018 with a CT angiogram of the chest ruled out pulmonary embolism or acute aortic syndrome.  However he had new filling of the right lower lobe segmental bronchi likely due to mucous plugging versus endobronchial tumor.  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  He was intubated and then transferred to [facility] 03/27/2018   Lab review March 16, 2018 at [place]: Elevated white count of 43,700 mostly neutrophils, elevated d-dimer at 3000, ABG 7.35/50 5/207, chemistry profile showing creatinine of 1.1 but with corrected calcium with hypercalcemia of 12.3, hyperkalemia 5.5 and elevated BNP at 3250 but a lactic acid that was normal and  troponin normal.  Significant Hospital Events   02/27/2018 - admit [place], intubated and unresponsive  Consults:    Procedures:  Chronic - portocath 03/04/2018 - ET tube  Significant Diagnostic Tests:    Micro Data:  Blood culture   Antimicrobials:  Vanc 12/28>>> Zosyn 12/28>>>  Interim history/subjective:  Agitated overnight, no new complaints  Objective   Blood pressure (!) 72/46, pulse 96, temperature 98.7 F (37.1 C), temperature source Oral, resp. rate 18, height 5' 8.5" (1.74 m), weight 52.6 kg, SpO2 94 %.    Vent Mode: PRVC FiO2 (%):  [40 %-50 %] 40 % Set Rate:  [18 bmp] 18 bmp Vt Set:  [550 mL] 550 mL PEEP:  [5 cmH20] 5 cmH20 Pressure Support:  [8 cmH20] 8 cmH20 Plateau Pressure:  [17 cmH20-29 cmH20] 23 cmH20   Intake/Output Summary (Last 24 hours) at Apr 07, 2018 1020 Last data filed at 2018-04-07 1000 Gross per 24 hour  Intake 1625.87 ml  Output 1140 ml  Net 485.87 ml   Filed Weights   03/24/18 1541  Weight: 52.6 kg   General Appearance:  Emaciated, chronically ill appearing, NAD HEENT: [place]/AT, PERRL, EOM-I and MMM Lungs: Decreased BS  diffusely Heart:  RRR, Nl S1/S2 and -M/R/G Abdomen:  Soft, NT, ND and +BS Neuro: Sedate, agitated, moving all ext to command Extremities:  Extremities- intact . L:eft hip  Skin:  ntact in exposed areas . Sacral area - not examined Neurologic:  Sedation - was on fent gtt/versed gtt , now diprivian gtt -> RASS - -3  I reviewed CXR myself, ETT is in a good position  LABS     PULMONARY Recent Labs  Lab 03/16/2018 1700 Apr 07, 2018 0309  PHART 7.430 7.362  PCO2ART 43.2 43.0  PO2ART 57.0* 55.0*  HCO3 28.6* 24.4  TCO2 30 26  O2SAT 90.0 87.0    CBC Recent Labs  Lab 03/16/2018 1620 04-07-2018 0650  HGB 10.3* 9.6*  HCT 33.9* 30.8*  WBC 46.1* 17.9*  PLT 291 237    COAGULATION Recent Labs  Lab 03/18/2018 1620  INR 0.94    CARDIAC   Recent Labs  Lab 03/05/2018 1620 2018/04/07 0019 2018-04-07 0650  TROPONINI 0.09* 0.12* 0.14*   No results for input(s): PROBNP in the last 168 hours.   CHEMISTRY Recent Labs  Lab 03/27/2018 1620 04/07/2018 0650  NA 139 139  K 6.2* 6.1*  CL 102 105  CO2 28 24  GLUCOSE 189* 123*  BUN 60* 72*  CREATININE 1.40* 2.02*  CALCIUM 10.2 9.0  MG 3.8* 4.2*  PHOS 4.9* 4.5   Estimated Creatinine Clearance: 24.6 mL/min (A) (by C-G formula based on SCr of 2.02 mg/dL (H)).  LIVER Recent Labs  Lab 03/01/2018 1620  AST 38  ALT 24  ALKPHOS 123  BILITOT 1.4*  PROT 5.8*  ALBUMIN 2.5*  INR 0.94   INFECTIOUS Recent Labs  Lab 03/15/2018 1620 03/23/2018 1853 04-07-18 0020  LATICACIDVEN 2.4* 2.7* 2.6*  PROCALCITON 31.18  --   --    ENDOCRINE CBG (last 3)  Recent Labs    2018-04-07 0018 2018-04-07 0404 2018/04/07 0720  GLUCAP 115* 128* 92   IMAGING x48h  - image(s) personally visualized  -   highlighted in bold CT chest - [place] - visualized and as described above  Resolved Hospital Problem list   x  Assessment & Plan:  ASSESSMENT / PLAN:  PULMONARY A:  - Baseline copd on o2 and ongoing smoking - non compliant - 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 Maintains BP/HR  P:  Neo as ordered Titrate for MAP of 65 mmHg IVF resuscitation  RENAL A:  At risk  for AKI - normal at [place] 03/02/2018  P:  Monitor D5 LR maintenance BMET in AM Titrate O2 for sat of 88-92%  ELECTROLYTES A:  Hypercalcemia at [facility] - ?  details P: Recheck Might need pamidronate Check iCa  GASTROINTESTINAL A:   Gastric Cancer Stage 1 hx on chemo x 1 year  - details in [place] Non specific abd symptoms x few days prior to admit  P:   OG tube ABd Xray Might need CT abd Hold TF  HEMATOLOGIC A:  Super high leukocytsis -at [place] - ? Duration, ? 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 Copd related , ? Sepsis related, ? HIV related P:   CT head of the head negative Hold off MRI for now  GLOBAL Patient is non-compliant with multiple medical problems to include HIV and gastric cancer that is non-compliant with medications and prognosis is very poor.  Will discuss with daughter when she arrives.  The patient is critically ill with multiple organ systems failure and requires high complexity decision making for assessment and support, frequent evaluation and titration of therapies, application of advanced monitoring technologies and extensive interpretation of multiple databases.   Critical Care Time devoted to patient care services described in this note is  40  Minutes. This time reflects time of care of this signee Dr Jennet Maduro. This critical care time does not reflect procedure time, or teaching time or supervisory time of PA/NP/Med student/Med Resident etc but could involve care discussion time.  Rush Farmer, M.D. [facility] Pulmonary/Critical Care Medicine. Pager:  [phone]. After hours pager: [phone].

## 2018-03-28 NOTE — Progress Notes (Addendum)
Brief Nutrition Note  Consult received for enteral/tube feeding initiation and management.  Adult Enteral Nutrition Protocol initiated. Full assessment to follow.  Addendum 1034: Per discussion with RN on the phone, Dr. Nelda Marseille would not like to feed patient at this time. RN reports abdominal distention. OGT placed to suction with coffee ground-like output of ~700 ml.   TF to be d/c at this time.  Admitting Dx: Acute Hypoxia Respiratory Failure   Body mass index is 17.38 kg/m. Pt meets criteria for underweight based on current BMI.  Labs:  Recent Labs  Lab 03/08/2018 1620 04-13-18 0650  NA 139 139  K 6.2* 6.1*  CL 102 105  CO2 28 24  BUN 60* 72*  CREATININE 1.40* 2.02*  CALCIUM 10.2 9.0  MG 3.8* 4.2*  PHOS 4.9* 4.5  GLUCOSE 189* 123*   Clayton Bibles, MS, RD, LDN [facility] Long Inpatient Clinical Dietitian Pager: [phone] After Hours Pager: [phone]

## 2018-03-28 NOTE — Progress Notes (Signed)
TOD 1800. Pt asystole, absent breath and lung sounds. IV mediations discontinued, family at bedside. Chaplain notified.

## 2018-03-28 NOTE — Progress Notes (Signed)
Spoke with patient's daughter and sister.  Patient is maxed out on neo through the port.  No other IV access is available to add more pressors.  I offered central line access but evidently patient has been failing to thrive for quite sometime.  I do not believe that further escalation of care is appropriate will be appropriate in this situation.  I discussed this with the family and decision was made for no further escalation of care.  Family is bringing in the rest of the family and if patient expires on his own with current interventions then family is ok with that if not then will discuss withdrawal.   The patient is critically ill with multiple organ systems failure and requires high complexity decision making for assessment and support, frequent evaluation and titration of therapies, application of advanced monitoring technologies and extensive interpretation of multiple databases.   Critical Care Time devoted to patient care services described in this note is  50  Minutes. This time reflects time of care of this signee Dr Jennet Maduro. This critical care time does not reflect procedure time, or teaching time or supervisory time of PA/NP/Med student/Med Resident etc but could involve care discussion time.  Rush Farmer, M.D. [facility] Pulmonary/Critical Care Medicine. Pager: [phone]. After hours pager: [phone].

## 2018-03-28 NOTE — Progress Notes (Signed)
   Apr 13, 2018 1800  Clinical Encounter Type  Visited With Patient not available;Family  Visit Type Death  Referral From Nurse  Spiritual Encounters  Spiritual Needs Prayer;Emotional;Grief support  [place] paged to support family post patient death; [place] offered emotional and grief support along with prayer.  Ch available if further support requested

## 2018-03-28 NOTE — Progress Notes (Signed)
eLink Physician-Brief Progress Note Patient Name: Mario Harding DOB: Nov 03, 1945 MRN: [id number]   Date of Service  2018/04/23  HPI/Events of Note  Multiple issues: 1. Hypotension - BP = 78/59 with MAP = 64 and 2. Oliguria   eICU Interventions  Will order: 1. Bolus with 0.9 NaCl 1 liter IV over 1 hour now.  2. Phenylephrine IV infusion. Titrate to MAP > 65.     Intervention Category Major Interventions: Hypotension - evaluation and management Intermediate Interventions: Oliguria - evaluation and management  Nautika Cressey Eugene April 23, 2018, 4:35 AM

## 2018-03-28 NOTE — Consult Note (Signed)
[place] [facility]ious Disease  Total days of antibiotics 6(started on 12/23 at outside hospital)       Reason for Consult: aspergilloma/hiv   Referring Physician: Chase Caller  Active Problems:   Acute respiratory failure [facility])    HPI: Mario Harding is a 73 y.o. 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 Daughter and sister of patient reports he has not been open about his health and unsure the status of his recent treatment from gastric cancer, nor do they mention anything about his hiv status. Recently patient has had weight loss, and worsening confusion over the last 2 weeks. He sustained GLF and left hip fracture on 12/22 where he had ORIF on 12/23 then discharged to SNF but developed worsening appetite, abdominal distention and shortness of breath. He was taken to [place] ED on 12/28 with hypoxia, progressively decompensating requiring intubation. He was transferred to [facility] for further management. Labs on admit 43.7K, K 5.5, BUn 64, Ca elevated. CXR shwoing bibasilar airspace opacities with RUL cavitary lesions - known for prior aspergilloma.   His right upper lobe cavity [aspergilloma] showed decrease in size.  He was intubated and then transferred to [facility] 03/26/2018. He has had continued decline, increasing pressor needs. He was started on vancomycin plus piptazo in addition to voriconazole. Thus far, resp cx have identified kleb pneumonaie. Other cultures pending and < 24 hr.   Past med hx: Copd pulm hypertension Pulmonary aspergillosis History of hypercalcemia ACD Hx of stage 1 T1N0M0 gastric adenoca followed by dr Bobby Rumpf in [place]- last chemo in beginning of dec HIV Left hip fracture s/p ORIF on Mar 19 2018 Allergies: Allergies not on file   MEDICATIONS: . chlorhexidine gluconate (MEDLINE KIT)  15 mL Mouth Rinse BID  . insulin aspart  2-6 Units Subcutaneous Q4H  .  ipratropium-albuterol  3 mL Nebulization Q6H  . mouth rinse  15 mL Mouth Rinse 10 times per day    Social History   Tobacco Use  . Smoking status: Not on file  Substance Use Topics  . Alcohol use: Not on file  . Drug use: Not on file    Family hx: diabetes Review of Systems - unable to obtain since patient is intubated and sedated   OBJECTIVE: Temp:  [97.5 F (36.4 C)-98.7 F (37.1 C)] 98.7 F (37.1 C) (12/29 0700) Pulse Rate:  [50-107] 107 (12/29 0900) Resp:  [12-30] 19 (12/29 1100) BP: (72-106)/(46-69) 84/57 (12/29 1100) SpO2:  [87 %-100 %] 87 % (12/29 0900) FiO2 (%):  [40 %-50 %] 40 % (12/29 0903) Weight:  [52.6 kg] 52.6 kg (12/28 1541) Physical Exam  Constitutional: He is sedated/intubated He appears well-developed and well-nourished. No distress.  HENT: poor dentition Mouth/Throat: OETT in place Cardiovascular: Normal rate, regular rhythm and normal heart sounds. Exam reveals no gallop and no friction rub.  No murmur heard.  Pulmonary/Chest: Effort normal and breath sounds normal. Bilateral scattered rhonchi Abdominal: Soft. Bowel sounds are decreased He exhibits  distension. There is no tenderness.  Ext: cachetic. Muscle loss Skin: Skin is warm and dry. Scattered echymosis Psychiatric: He is sedated    LABS: Results for orders placed or performed during the hospital encounter of 03/08/2018 (from the past 48 hour(s))  Glucose, capillary     Status: Abnormal   Collection Time: 03/11/2018  3:36 PM  Result Value Ref Range   Glucose-Capillary 164 (H) 70 - 99 mg/dL  Culture, respiratory (non-expectorated)  Status: None (Preliminary result)   Collection Time: 03/26/2018  4:01 PM  Result Value Ref Range   Specimen Description TRACHEAL ASPIRATE    Special Requests      NONE Performed at [facility], [place] [address] [place], [place] [address]    Gram Stain      NO WBC SEEN FEW GRAM POSITIVE RODS RARE GRAM NEGATIVE RODS RARE GRAM POSITIVE COCCI    Culture  FEW GRAM NEGATIVE RODS    Report Status PENDING   Comprehensive metabolic panel     Status: Abnormal   Collection Time: 02/27/2018  4:20 PM  Result Value Ref Range   Sodium 139 135 - 145 mmol/L   Potassium 6.2 (H) 3.5 - 5.1 mmol/L   Chloride 102 98 - 111 mmol/L   CO2 28 22 - 32 mmol/L   Glucose, Bld 189 (H) 70 - 99 mg/dL   BUN 60 (H) 8 - 23 mg/dL   Creatinine, Ser 1.40 (H) 0.61 - 1.24 mg/dL   Calcium 10.2 8.9 - 10.3 mg/dL   Total Protein 5.8 (L) 6.5 - 8.1 g/dL   Albumin 2.5 (L) 3.5 - 5.0 g/dL   AST 38 15 - 41 U/L   ALT 24 0 - 44 U/L   Alkaline Phosphatase 123 38 - 126 U/L   Total Bilirubin 1.4 (H) 0.3 - 1.2 mg/dL   GFR calc non Af Amer 50 (L) >60 mL/min   GFR calc Af Amer 58 (L) >60 mL/min   Anion gap 9 5 - 15    Comment: Performed at [facility], [place] [address], [place], [place] [address]  Magnesium     Status: Abnormal   Collection Time: 03/13/2018  4:20 PM  Result Value Ref Range   Magnesium 3.8 (H) 1.7 - 2.4 mg/dL    Comment: Performed at [facility] [address], [place], [place] [address]  Phosphorus     Status: Abnormal   Collection Time: 02/26/2018  4:20 PM  Result Value Ref Range   Phosphorus 4.9 (H) 2.5 - 4.6 mg/dL    Comment: Performed at [facility] [address], [place],  [address]  Amylase     Status: Abnormal   Collection Time: 03/12/2018  4:20 PM  Result Value Ref Range   Amylase 138 (H) 28 - 100 U/L    Comment: Performed at [facility] [address] [place],  [address]  Lipase, blood     Status: None   Collection Time: 03/02/2018  4:20 PM  Result Value Ref Range   Lipase 20 11 - 51 U/L    Comment: Performed at [facility] [address] [place], [place] [address]  Troponin I - Now Then Q6H     Status: Abnormal   Collection Time: 03/06/2018  4:20 PM  Result Value Ref Range   Troponin I 0.09 (HH) <0.03 ng/mL    Comment: CRITICAL RESULT CALLED TO, READ BACK BY AND VERIFIED WITH: L CAUDLE,RN AT 1735 03/02/2018  BY L BENFIELD Performed at [facility], [place] [address], [place], [place] [address]   Lactic acid, plasma     Status: Abnormal   Collection Time: 03/22/2018  4:20 PM  Result Value Ref Range   Lactic Acid, Venous 2.4 (HH) 0.5 - 1.9 mmol/L    Comment: CRITICAL RESULT CALLED TO, READ BACK BY AND VERIFIED WITH: L CAUDLE,RN AT 1718 03/27/2018 BY L BENFIELD Performed at [facility], [place] [address], [place], [place] [address]   Procalcitonin     Status: None   Collection Time: 03/21/2018  4:20 PM  Result Value Ref Range   Procalcitonin 31.18 ng/mL    Comment:        Interpretation: PCT >= 10 ng/mL: Important systemic inflammatory response, almost exclusively due to severe bacterial sepsis or septic shock. (NOTE)       Sepsis PCT Algorithm           Lower Respiratory Tract                                      Infection PCT Algorithm    ----------------------------     ----------------------------         PCT < 0.25 ng/mL                PCT < 0.10 ng/mL         Strongly encourage             Strongly discourage   discontinuation of antibiotics    initiation of antibiotics    ----------------------------     -----------------------------       PCT 0.25 - 0.50 ng/mL            PCT 0.10 - 0.25 ng/mL               OR       >80% decrease in PCT            Discourage initiation of                                            antibiotics      Encourage discontinuation           of antibiotics    ----------------------------     -----------------------------         PCT >= 0.50 ng/mL              PCT 0.26 - 0.50 ng/mL                AND       <80% decrease in PCT             Encourage initiation of                                             antibiotics       Encourage continuation           of antibiotics    ----------------------------     -----------------------------        PCT >= 0.50 ng/mL                  PCT > 0.50 ng/mL               AND         increase in PCT                   Strongly encourage  initiation of antibiotics    Strongly encourage escalation           of antibiotics                                     -----------------------------                                           PCT <= 0.25 ng/mL                                                 OR                                        > 80% decrease in PCT                                     Discontinue / Do not initiate                                             antibiotics Performed at [facility], [address] [place], [place] [address]   Brain natriuretic peptide     Status: Abnormal   Collection Time: 02/26/2018  4:20 PM  Result Value Ref Range   B Natriuretic Peptide 400.5 (H) 0.0 - 100.0 pg/mL    Comment: Performed at [facility] [address] [place], [place] [address]  Cortisol     Status: None   Collection Time: 03/03/2018  4:20 PM  Result Value Ref Range   Cortisol, Plasma 36.6 ug/dL    Comment: (NOTE) AM    6.7 - 22.6 ug/dL PM   <10.0       ug/dL Performed at [facility] [address], [place], [place] [address]   CBC WITH DIFFERENTIAL     Status: Abnormal   Collection Time: 03/19/2018  4:20 PM  Result Value Ref Range   WBC 46.1 (H) 4.0 - 10.5 K/uL   RBC 3.06 (L) 4.22 - 5.81 MIL/uL   Hemoglobin 10.3 (L) 13.0 - 17.0 g/dL   HCT 33.9 (L) 39.0 - 52.0 %   MCV 110.8 (H) 80.0 - 100.0 fL   MCH 33.7 26.0 - 34.0 pg   MCHC 30.4 30.0 - 36.0 g/dL   RDW 15.9 (H) 11.5 - 15.5 %   Platelets 291 150 - 400 K/uL   nRBC 0.0 0.0 - 0.2 %   Neutrophils Relative % 95 %   Neutro Abs 44.1 (H) 1.7 - 7.7 K/uL   Lymphocytes Relative 0 %   Lymphs Abs 0.0 (L) 0.7 - 4.0 K/uL   Monocytes Relative 3 %   Monocytes Absolute 1.2 (H) 0.1 - 1.0 K/uL   Eosinophils Relative 0 %   Eosinophils Absolute 0.0 0.0 - 0.5 K/uL   Basophils Relative 0 %   Basophils Absolute 0.1 0.0 - 0.1  K/uL   WBC Morphology INCREASED BANDS (>20% BANDS)     Comment:  VACUOLATED NEUTROPHILS   Immature Granulocytes 2 %   Abs Immature Granulocytes 0.72 (H) 0.00 - 0.07 K/uL    Comment: Performed at [facility] [address], [place], [place] [address]  Protime-INR     Status: None   Collection Time: 03/23/2018  4:20 PM  Result Value Ref Range   Prothrombin Time 12.5 11.4 - 15.2 seconds   INR 0.94     Comment: Performed at [facility] [address], [place], [place] [address]  APTT     Status: None   Collection Time: 03/26/2018  4:20 PM  Result Value Ref Range   aPTT 25 24 - 36 seconds    Comment: Performed at [facility] [address], [place], [place] [address]  Triglycerides     Status: None   Collection Time: 03/02/2018  4:20 PM  Result Value Ref Range   Triglycerides 85 <150 mg/dL    Comment: Performed at [facility], [place] [address] [place], [place] [address]  Glucose, capillary     Status: Abnormal   Collection Time: 03/18/2018  4:39 PM  Result Value Ref Range   Glucose-Capillary 166 (H) 70 - 99 mg/dL  Urinalysis, Routine w reflex microscopic     Status: Abnormal   Collection Time: 03/19/2018  4:41 PM  Result Value Ref Range   Color, Urine AMBER (A) YELLOW    Comment: BIOCHEMICALS MAY BE AFFECTED BY COLOR   APPearance CLEAR CLEAR   Specific Gravity, Urine 1.033 (H) 1.005 - 1.030   pH 6.0 5.0 - 8.0   Glucose, UA NEGATIVE NEGATIVE mg/dL   Hgb urine dipstick SMALL (A) NEGATIVE   Bilirubin Urine NEGATIVE NEGATIVE   Ketones, ur NEGATIVE NEGATIVE mg/dL   Protein, ur 30 (A) NEGATIVE mg/dL   Nitrite NEGATIVE NEGATIVE   Leukocytes, UA NEGATIVE NEGATIVE   RBC / HPF 0-5 0 - 5 RBC/hpf   WBC, UA 0-5 0 - 5 WBC/hpf   Bacteria, UA RARE (A) NONE SEEN   Squamous Epithelial / LPF 0-5 0 - 5   Mucus PRESENT    Hyaline Casts, UA PRESENT     Comment: Performed at [facility], [address], [place], [place] [address]  Culture, blood (routine x 2)     Status: None (Preliminary result)   Collection Time: 03/19/2018  4:55 PM    Result Value Ref Range   Specimen Description BLOOD RIGHT HAND    Special Requests      BOTTLES DRAWN AEROBIC ONLY Blood Culture results may not be optimal due to an inadequate volume of blood received in culture bottles   Culture      NO GROWTH < 24 HOURS Performed at [facility] [address], [place], [place] [address]    Report Status PENDING   Culture, blood (routine x 2)     Status: None (Preliminary result)   Collection Time: 03/01/2018  4:58 PM  Result Value Ref Range   Specimen Description BLOOD RIGHT ANTECUBITAL    Special Requests      BOTTLES DRAWN AEROBIC ONLY Blood Culture results may not be optimal due to an inadequate volume of blood received in culture bottles   Culture      NO GROWTH < 24 HOURS Performed at [facility] [address] [place], [place] [address]    Report Status PENDING  I-STAT 3, arterial blood gas (G3+)     Status: Abnormal   Collection Time: 03/23/2018  5:00 PM  Result Value Ref Range   pH, Arterial 7.430 7.350 - 7.450   pCO2 arterial 43.2 32.0 - 48.0 mmHg   pO2, Arterial 57.0 (L) 83.0 - 108.0 mmHg   Bicarbonate 28.6 (H) 20.0 - 28.0 mmol/L   TCO2 30 22 - 32 mmol/L   O2 Saturation 90.0 %   Acid-Base Excess 4.0 (H) 0.0 - 2.0 mmol/L   Patient temperature 98.6 F    Collection site RADIAL, ALLEN'S TEST ACCEPTABLE    Drawn by RT    Sample type ARTERIAL   MRSA PCR Screening     Status: None   Collection Time: 03/02/2018  5:14 PM  Result Value Ref Range   MRSA by PCR NEGATIVE NEGATIVE    Comment:        The GeneXpert MRSA Assay (FDA approved for NASAL specimens only), is one component of a comprehensive MRSA colonization surveillance program. It is not intended to diagnose MRSA infection nor to guide or monitor treatment for MRSA infections. Performed at [facility], [place] [address] [place], [place] [address]   Respiratory Panel by PCR     Status: None   Collection Time: 03/11/2018  5:52 PM  Result Value Ref Range    Adenovirus NOT DETECTED NOT DETECTED   Coronavirus 229E NOT DETECTED NOT DETECTED   Coronavirus HKU1 NOT DETECTED NOT DETECTED   Coronavirus NL63 NOT DETECTED NOT DETECTED   Coronavirus OC43 NOT DETECTED NOT DETECTED   Metapneumovirus NOT DETECTED NOT DETECTED   Rhinovirus / Enterovirus NOT DETECTED NOT DETECTED   Influenza A NOT DETECTED NOT DETECTED   Influenza B NOT DETECTED NOT DETECTED   Parainfluenza Virus 1 NOT DETECTED NOT DETECTED   Parainfluenza Virus 2 NOT DETECTED NOT DETECTED   Parainfluenza Virus 3 NOT DETECTED NOT DETECTED   Parainfluenza Virus 4 NOT DETECTED NOT DETECTED   Respiratory Syncytial Virus NOT DETECTED NOT DETECTED   Bordetella pertussis NOT DETECTED NOT DETECTED   Chlamydophila pneumoniae NOT DETECTED NOT DETECTED   Mycoplasma pneumoniae NOT DETECTED NOT DETECTED    Comment: Performed at [facility] [address], [place], [place] [address]  Lactic acid, plasma     Status: Abnormal   Collection Time: 03/11/2018  6:53 PM  Result Value Ref Range   Lactic Acid, Venous 2.7 (HH) 0.5 - 1.9 mmol/L    Comment: CRITICAL RESULT CALLED TO, READ BACK BY AND VERIFIED WITH: R [facility] AT 1933 02/28/2018 BY L BENFIELD Performed at [facility], [place] [address], [place], [place] [address]   Glucose, capillary     Status: Abnormal   Collection Time: 03/14/2018  8:46 PM  Result Value Ref Range   Glucose-Capillary 145 (H) 70 - 99 mg/dL  Glucose, capillary     Status: Abnormal   Collection Time: 04-22-2018 12:18 AM  Result Value Ref Range   Glucose-Capillary 115 (H) 70 - 99 mg/dL   Comment 1 Notify RN   Troponin I - Now Then Q6H     Status: Abnormal   Collection Time: 2018/04/22 12:19 AM  Result Value Ref Range   Troponin I 0.12 (HH) <0.03 ng/mL    Comment: CRITICAL VALUE NOTED.  VALUE IS CONSISTENT WITH PREVIOUSLY REPORTED AND CALLED VALUE. Performed at [facility], [place] [address], [place], [place] [address]   Lactic acid, plasma     Status: Abnormal  Collection Time: 04/20/2018 12:20 AM  Result Value Ref Range   Lactic Acid, Venous 2.6 (HH) 0.5 - 1.9 mmol/L    Comment: CRITICAL RESULT CALLED TO, READ BACK BY AND VERIFIED WITH: YOUSEF,M RN 04/20/2018 0135 JORDANS Performed at [facility], [place] [address], [place], [place] [address]   I-STAT 3, arterial blood gas (G3+)     Status: Abnormal   Collection Time: Apr 20, 2018  3:09 AM  Result Value Ref Range   pH, Arterial 7.362 7.350 - 7.450   pCO2 arterial 43.0 32.0 - 48.0 mmHg   pO2, Arterial 55.0 (L) 83.0 - 108.0 mmHg   Bicarbonate 24.4 20.0 - 28.0 mmol/L   TCO2 26 22 - 32 mmol/L   O2 Saturation 87.0 %   Acid-base deficit 1.0 0.0 - 2.0 mmol/L   Patient temperature 98.6 F    Collection site RADIAL, ALLEN'S TEST ACCEPTABLE    Drawn by RT    Sample type ARTERIAL   Glucose, capillary     Status: Abnormal   Collection Time: 2018-04-20  4:04 AM  Result Value Ref Range   Glucose-Capillary 128 (H) 70 - 99 mg/dL   Comment 1 Notify RN   Troponin I - Now Then Q6H     Status: Abnormal   Collection Time: April 20, 2018  6:50 AM  Result Value Ref Range   Troponin I 0.14 (HH) <0.03 ng/mL    Comment: CRITICAL VALUE NOTED.  VALUE IS CONSISTENT WITH PREVIOUSLY REPORTED AND CALLED VALUE. Performed at [facility], [place] [address], [place], [place] [address]   CBC     Status: Abnormal   Collection Time: 20-Apr-2018  6:50 AM  Result Value Ref Range   WBC 17.9 (H) 4.0 - 10.5 K/uL   RBC 2.75 (L) 4.22 - 5.81 MIL/uL   Hemoglobin 9.6 (L) 13.0 - 17.0 g/dL   HCT 30.8 (L) 39.0 - 52.0 %   MCV 112.0 (H) 80.0 - 100.0 fL   MCH 34.9 (H) 26.0 - 34.0 pg   MCHC 31.2 30.0 - 36.0 g/dL   RDW 16.2 (H) 11.5 - 15.5 %   Platelets 237 150 - 400 K/uL   nRBC 0.0 0.0 - 0.2 %    Comment: Performed at [facility] [address] [place], [place] [address]  Basic metabolic panel     Status: Abnormal   Collection Time: 04/20/2018  6:50 AM  Result Value Ref Range   Sodium 139 135 - 145 mmol/L   Potassium 6.1 (H) 3.5  - 5.1 mmol/L   Chloride 105 98 - 111 mmol/L   CO2 24 22 - 32 mmol/L   Glucose, Bld 123 (H) 70 - 99 mg/dL   BUN 72 (H) 8 - 23 mg/dL   Creatinine, Ser 2.02 (H) 0.61 - 1.24 mg/dL   Calcium 9.0 8.9 - 10.3 mg/dL   GFR calc non Af Amer 32 (L) >60 mL/min   GFR calc Af Amer 37 (L) >60 mL/min   Anion gap 10 5 - 15    Comment: Performed at [facility] [address], [place], [place] [address]  Magnesium     Status: Abnormal   Collection Time: 20-Apr-2018  6:50 AM  Result Value Ref Range   Magnesium 4.2 (H) 1.7 - 2.4 mg/dL    Comment: Performed at [facility] [address], [place], [place] [address]  Phosphorus     Status: None   Collection Time: 20-Apr-2018  6:50 AM  Result Value  Ref Range   Phosphorus 4.5 2.5 - 4.6 mg/dL    Comment: Performed at [facility] [address] [place], [place] [address]  Glucose, capillary     Status: None   Collection Time: 04/11/18  7:20 AM  Result Value Ref Range   Glucose-Capillary 92 70 - 99 mg/dL  Magnesium     Status: Abnormal   Collection Time: Apr 11, 2018 10:14 AM  Result Value Ref Range   Magnesium 4.3 (H) 1.7 - 2.4 mg/dL    Comment: Performed at [facility] [address], [place], [place] [address]  Phosphorus     Status: Abnormal   Collection Time: 2018-04-11 10:14 AM  Result Value Ref Range   Phosphorus 4.9 (H) 2.5 - 4.6 mg/dL    Comment: Performed at [facility] [address] [place], [place] [address]    MICRO:  IMAGING: Ct Head Wo Contrast  Result Date: 04/11/2018 CLINICAL DATA:  Encephalopathy EXAM: CT HEAD WITHOUT CONTRAST TECHNIQUE: Contiguous axial images were obtained from the base of the skull through the vertex without intravenous contrast. COMPARISON:  Head CT 03/18/2018 FINDINGS: Brain: There is no mass, hemorrhage or extra-axial collection. The size and configuration of the ventricles and extra-axial CSF spaces are normal. The brain parenchyma is normal, without evidence of acute or chronic  infarction. There is bilateral basal ganglia mineralization. Vascular: No abnormal hyperdensity of the major intracranial arteries or dural venous sinuses. No intracranial atherosclerosis. Skull: The visualized skull base, calvarium and extracranial soft tissues are normal. Sinuses/Orbits: No fluid levels or advanced mucosal thickening of the visualized paranasal sinuses. No mastoid or middle ear effusion. The orbits are normal. IMPRESSION: No acute intracranial abnormality. Electronically Signed   By: Ulyses Jarred M.D.   On: 04/11/2018 02:26   Dg Chest Port 1 View  Result Date: 2018-04-11 CLINICAL DATA:  Acute respiratory failure. Intubation. EXAM: PORTABLE CHEST 1 VIEW COMPARISON:  03/01/2018 and 03/18/2018 FINDINGS: Endotracheal tube in good position 5.4 cm above the carina. Port-A-Cath tip at the cavoatrial junction. NG tube tip below the diaphragm. Heart size is normal. Chronic prominence of the main pulmonary arteries. Chronic accentuation of the interstitial markings at the bases. Unchanged inflammatory disease in the right upper lobe with soft tissue nodules in blebs demonstrated on the prior CT scan. No significant change since the prior study. IMPRESSION: Endotracheal tube in good position. No change since the prior study. Electronically Signed   By: Lorriane Shire M.D.   On: 2018/04/11 11:06   Dg Chest Port 1 View  Result Date: 03/22/2018 CLINICAL DATA:  Endotracheal tube. EXAM: PORTABLE CHEST 1 VIEW COMPARISON:  Radiograph of March 24, 2018. FINDINGS: The heart size and mediastinal contours are within normal limits. Endotracheal tube is unchanged in position. Interval placement of nasogastric tube which is seen entering proximal stomach. Right internal jugular Port-A-Cath is unchanged in position. No pneumothorax or pleural effusion is noted. Stable right upper lobe opacity is noted concerning for possible pneumonia. Left lung is clear. The visualized skeletal structures are unremarkable.  IMPRESSION: Endotracheal and nasogastric tubes are in grossly good position. Stable right upper lobe opacity as described above. Electronically Signed   By: Marijo Conception, M.D.   On: 03/20/2018 21:31   Dg Abd Portable 1v  Result Date: 03/03/2018 CLINICAL DATA:  Gastric cancer. EXAM: PORTABLE ABDOMEN - 1 VIEW COMPARISON:  None. FINDINGS: Dilated small bowel loops are noted concerning for distal small bowel obstruction. No colonic dilatation is noted. Distal  tip of nasogastric tube seen in expected position of proximal stomach. Phleboliths are noted in the pelvis. IMPRESSION: Small bowel dilatation is noted concerning for distal small bowel obstruction. Electronically Signed   By: Marijo Conception, M.D.   On: 03/23/2018 21:32    Assessment/Plan:  73yo M with hiv disease, pulmonary aspergillosis, and receiving chemotherapy for gastric cancer. Recently sustained left hip fracture s/p ORIF on 12/23  But returned to ED from SNF on 12/28 for respiratory distress requiring intubation and sepsis- needing ongoing pressor requirements  CXR suggestive of pneumonia-health care associated = cx showing kleb pneumonaie = continue with piptazo. Until sensitivities return. He may have other co-infection. Continue on vancomycin for the next day  hiv disease= since having high gastric output and feedings are on hold. Please hold off of giving his ART. We will check cd 4 count and hiv viral load. [facility] [place]  Gastric cancer = had episode of hypercalcemia, unclear state on his treatment. If getting palliative chemo. Would reach out to dr [facility] tomorrow to get records  Pulmonary aspergillosis = continue with voriconazole dosing. Previously taking orals with vori '200mg'$  bid  Overall prognosis is poor -tenuous given having increasing pressor needs. Agree with goals of care meeting with his daughter  Dr Johnnye Sima to see tomorrow

## 2018-03-28 NOTE — Progress Notes (Signed)
CRITICAL VALUE ALERT  Critical Value: CBG 63  Date & Time Notied:  2018/03/31 1145 am   Provider Notified: Dr. Nelda Marseille   Orders Received/Actions taken: Standard Order Hypoglycemic protocol- half amp of D50

## 2018-03-28 DEATH — deceased

## 2018-03-29 LAB — CULTURE, BLOOD (ROUTINE X 2): Culture: NO GROWTH

## 2018-03-31 LAB — CULTURE, BLOOD (ROUTINE X 2)

## 2018-04-02 ENCOUNTER — Telehealth: Payer: Self-pay

## 2018-04-02 NOTE — Telephone Encounter (Signed)
On 04/02/2018 I received a dc from [facility] (original). Dc is for burial. Patient is a patient of Doctor Nelda Marseille. DC will be taken to Pulmonary Unit for signature.

## 2018-04-05 ENCOUNTER — Telehealth: Payer: Self-pay | Admitting: *Deleted

## 2018-04-05 NOTE — Telephone Encounter (Signed)
Received D/C form Provider-[employer] were notified to pick up

## 2018-04-28 NOTE — Death Summary Note (Signed)
DEATH SUMMARY   Patient Details  Name: Mario Harding MRN: [id number] DOB: 08/14/1945  Admission/Discharge Information   Admit Date:  2018-04-21  Date of Death: Date of Death: 04-22-18  Time of Death: Time of Death: 38  Length of Stay: 1  Referring Physician: Raelyn Number, MD   Reason(s) for Hospitalization  Acute hypoxemic respiratory failure  Diagnoses  Preliminary cause of death:   Acute hypoxemic respiratory failure  Secondary Diagnoses (including complications and co-morbidities):  Active Problems:   Acute respiratory failure (HCC)   HIV disease ([place])   Pulmonary aspergillosis (HCC)   Leukocytosis   Pneumonia of left upper lobe due to Klebsiella pneumoniae [facility])   Brief Hospital Course (including significant findings, care, treatment, and services provided and events leading to death)  73 year old male who gets his care at the [place] and at [facility].  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 Bobby Rumpf for which he takes chemotherapy every few weeks at [facility].  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  According to the daughter for the last 2 weeks he has had on and off confusion NOS and then on March 18, 2018 fell down and sustained a left hip fracture for which he was operated on March 19, 2018 [ORIF per nursing history] and then approximately March 23, 2018 was sent to skilled nursing facility where confusion persisted and he  could not participate in rehab.  Then on the night of 23 March 2018 he developed hypoxemia and worsening confusion and admitted to [facility] emergency department 04-21-2018 with a CT angiogram of the chest ruled out pulmonary embolism or acute aortic syndrome.  However he had new filling of the right lower lobe segmental bronchi likely due to mucous plugging versus endobronchial tumor.  His right upper lobe cavity [aspergilloma] showed decrease in size.  He was intubated and then transferred to [facility] Apr 21, 2018  Spoke with patient's daughter and sister.  Patient is maxed out on neo through the port.  No other IV access is available to add more pressors.  I offered central line access but evidently patient has been failing to thrive for quite sometime.  I do not believe that further escalation of care is appropriate will be appropriate in this situation.  I discussed this with the family and decision was made for no further escalation of care.  Family is bringing in the rest of the family and if patient expires on his own with current interventions then family is ok with that if not then will discuss withdrawal.   Patient expired while on the vent.     Pertinent Labs and Studies  Significant Diagnostic Studies Ct Head Wo Contrast  Result Date: 04/22/18 CLINICAL DATA:  Encephalopathy EXAM: CT HEAD WITHOUT CONTRAST TECHNIQUE: Contiguous axial images were obtained from the base of the skull through the vertex without intravenous contrast. COMPARISON:  Head CT 03/18/2018 FINDINGS: Brain: There is no mass, hemorrhage or extra-axial collection. The size and configuration of the ventricles and extra-axial CSF spaces are  normal. The brain parenchyma is normal, without evidence of acute or chronic infarction. There is bilateral basal ganglia mineralization. Vascular: No abnormal hyperdensity of the major intracranial arteries or dural venous sinuses. No intracranial atherosclerosis.  Skull: The visualized skull base, calvarium and extracranial soft tissues are normal. Sinuses/Orbits: No fluid levels or advanced mucosal thickening of the visualized paranasal sinuses. No mastoid or middle ear effusion. The orbits are normal. IMPRESSION: No acute intracranial abnormality. Electronically Signed   By: Ulyses Jarred M.D.   On: 2018-04-06 02:26   Dg Chest Port 1 View  Result Date: 2018-04-06 CLINICAL DATA:  Acute respiratory failure. Intubation. EXAM: PORTABLE CHEST 1 VIEW COMPARISON:  03/02/2018 and 03/18/2018 FINDINGS: Endotracheal tube in good position 5.4 cm above the carina. Port-A-Cath tip at the cavoatrial junction. NG tube tip below the diaphragm. Heart size is normal. Chronic prominence of the main pulmonary arteries. Chronic accentuation of the interstitial markings at the bases. Unchanged inflammatory disease in the right upper lobe with soft tissue nodules in blebs demonstrated on the prior CT scan. No significant change since the prior study. IMPRESSION: Endotracheal tube in good position. No change since the prior study. Electronically Signed   By: Lorriane Shire M.D.   On: Apr 06, 2018 11:06   Dg Chest Port 1 View  Result Date: 03/12/2018 CLINICAL DATA:  Endotracheal tube. EXAM: PORTABLE CHEST 1 VIEW COMPARISON:  Radiograph of March 24, 2018. FINDINGS: The heart size and mediastinal contours are within normal limits. Endotracheal tube is unchanged in position. Interval placement of nasogastric tube which is seen entering proximal stomach. Right internal jugular Port-A-Cath is unchanged in position. No pneumothorax or pleural effusion is noted. Stable right upper lobe opacity is noted concerning for possible pneumonia. Left lung is clear. The visualized skeletal structures are unremarkable. IMPRESSION: Endotracheal and nasogastric tubes are in grossly good position. Stable right upper lobe opacity as described above. Electronically Signed   By: Marijo Conception, M.D.   On:  03/22/2018 21:31   Dg Abd Portable 1v  Result Date: 03/01/2018 CLINICAL DATA:  Gastric cancer. EXAM: PORTABLE ABDOMEN - 1 VIEW COMPARISON:  None. FINDINGS: Dilated small bowel loops are noted concerning for distal small bowel obstruction. No colonic dilatation is noted. Distal tip of nasogastric tube seen in expected position of proximal stomach. Phleboliths are noted in the pelvis. IMPRESSION: Small bowel dilatation is noted concerning for distal small bowel obstruction. Electronically Signed   By: Marijo Conception, M.D.   On: 03/24/2018 21:32    Microbiology No results found for this or any previous visit (from the past 240 hour(s)).  Lab Basic Metabolic Panel: No results for input(s): NA, K, CL, CO2, GLUCOSE, BUN, CREATININE, CALCIUM, MG, PHOS in the last 168 hours. Liver Function Tests: No results for input(s): AST, ALT, ALKPHOS, BILITOT, PROT, ALBUMIN in the last 168 hours. No results for input(s): LIPASE, AMYLASE in the last 168 hours. No results for input(s): AMMONIA in the last 168 hours. CBC: No results for input(s): WBC, NEUTROABS, HGB, HCT, MCV, PLT in the last 168 hours. Cardiac Enzymes: No results for input(s): CKTOTAL, CKMB, CKMBINDEX, TROPONINI in the last 168 hours. Sepsis Labs: No results for input(s): PROCALCITON, WBC, LATICACIDVEN in the last 168 hours.  Procedures/Operations     Fiana Gladu 04/09/2018, 10:57 AM

## 2019-10-21 IMAGING — CT CT HEAD W/O CM
4 series · 15 of 47 positions shown, 17 images · non-contrast
Comparison: Head CT 03/18/2018

CLINICAL DATA: Encephalopathy

EXAM:
CT HEAD WITHOUT CONTRAST
TECHNIQUE: Contiguous axial images were obtained from the base of the skull
through the vertex without intravenous contrast.

[Series 3: head without · axial · non-contrast · 0.44mm/px · z∈[+1300,+1440]mm · 7 of 38 slices shown, 9 images]
[im 5/38  brain]
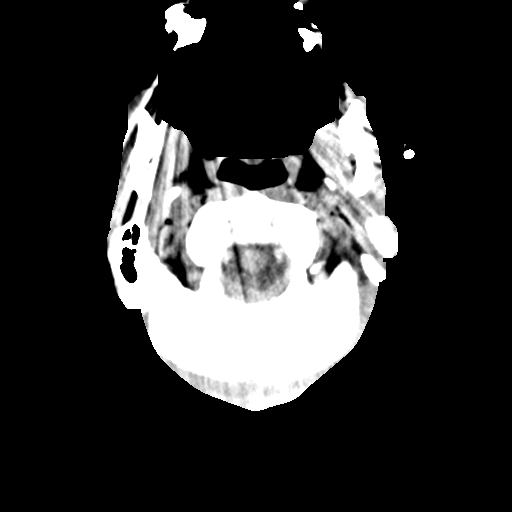
[im 5/38  bone]
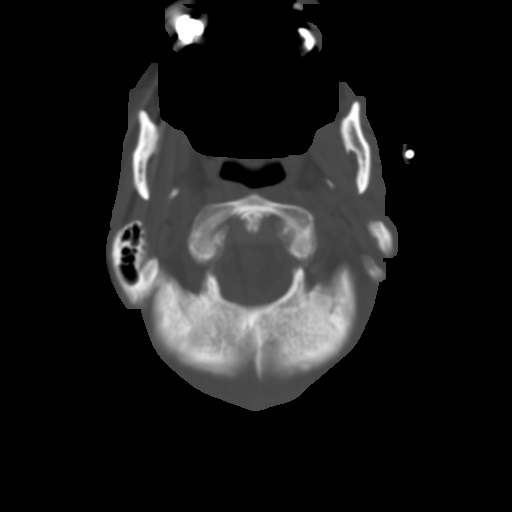
[im 10/38  brain]
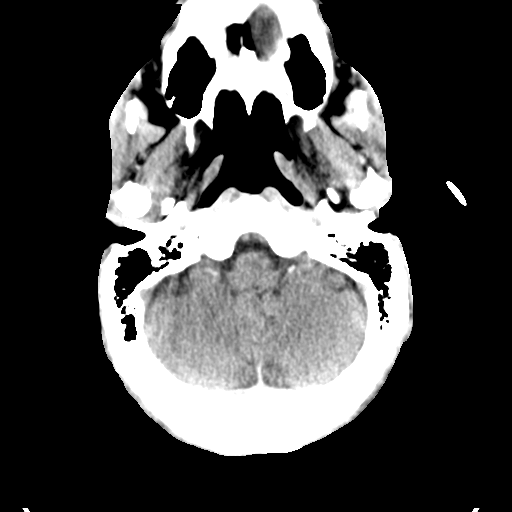
[im 14/38  brain]
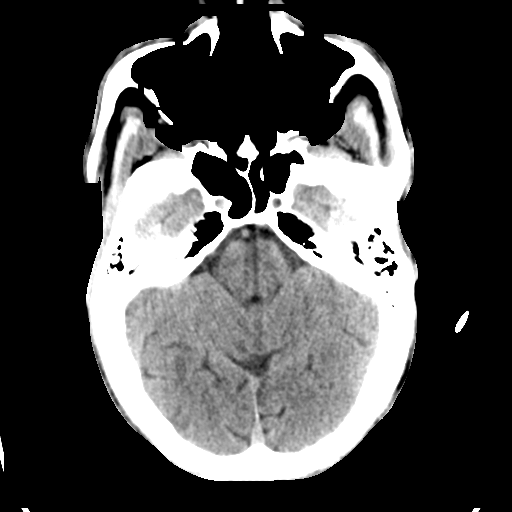
[im 19/38  brain]
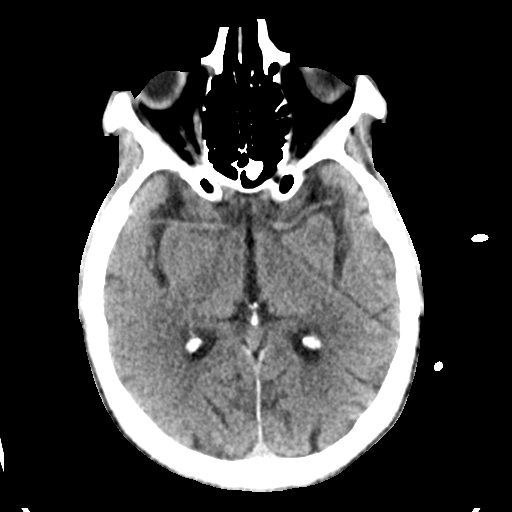
[im 24/38  brain]
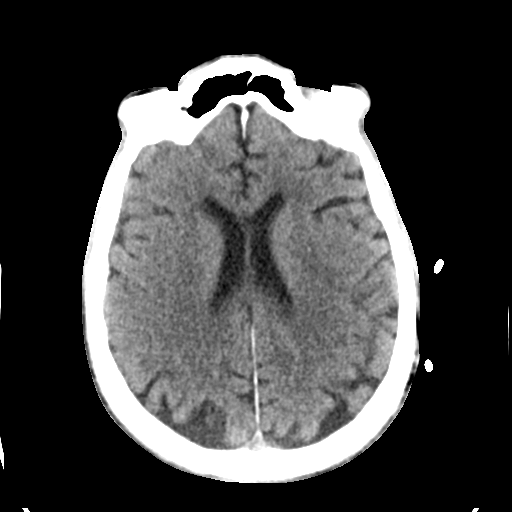
[im 24/38  bone]
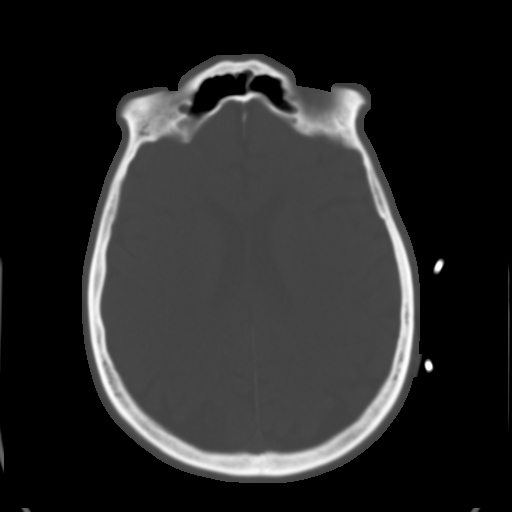
[im 28/38  brain]
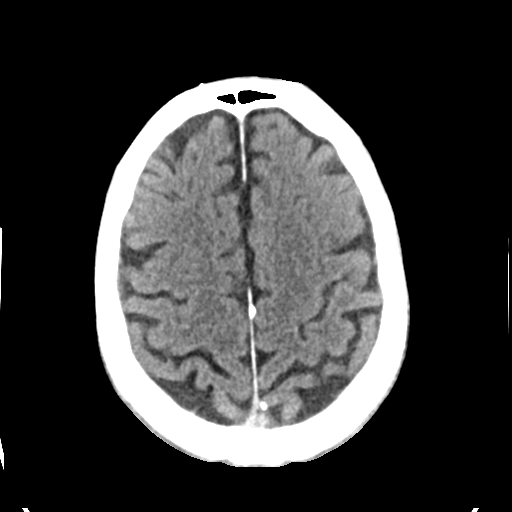
[im 33/38  brain]
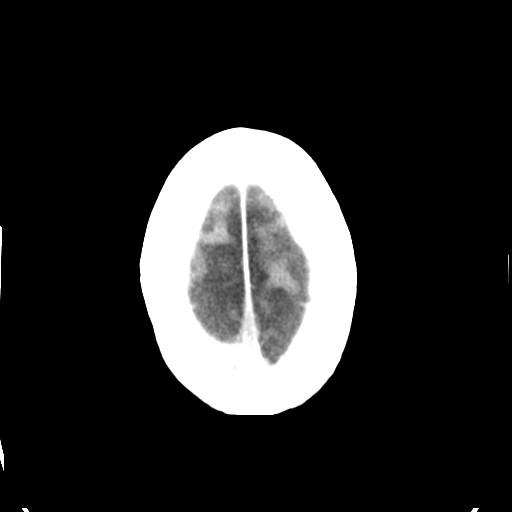

[Series 4: head bone · axial · 0.44mm/px · z∈[+1298,+1316]mm · 2 of 94 slices shown]
[im 10/94  bone]
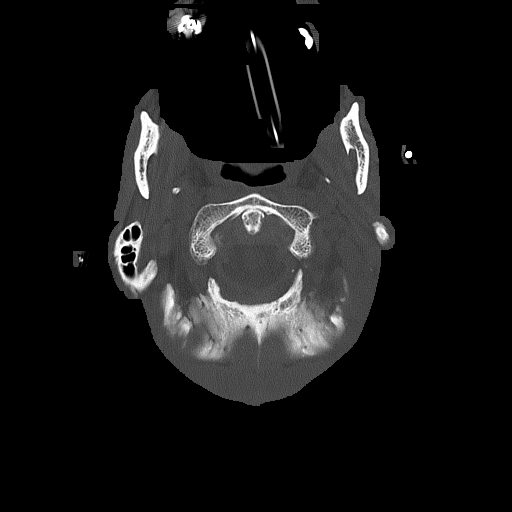
[im 19/94  bone]
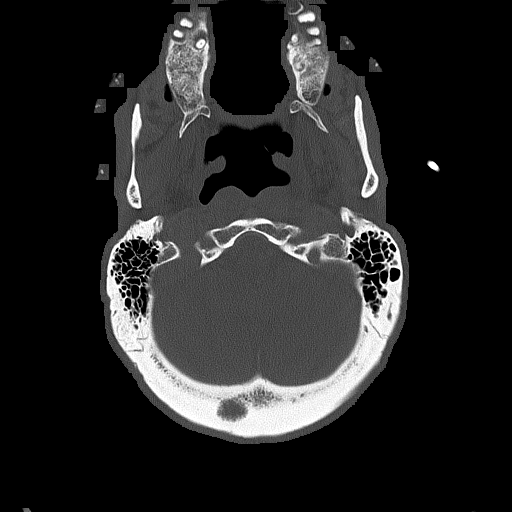

[Series 5: head without cor · coronal · non-contrast · 0.37mm/px · 3 of 78 slices shown]
[im 26/78  brain]
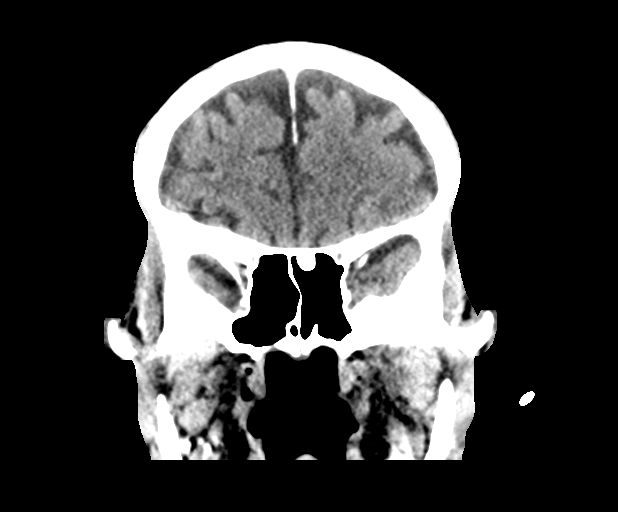
[im 35/78  brain]
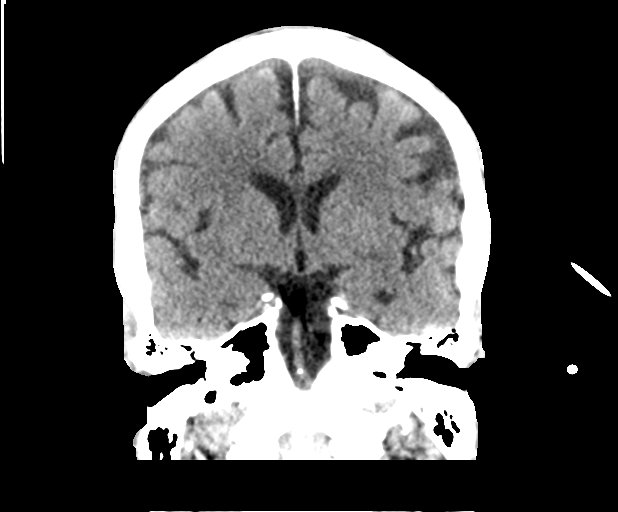
[im 43/78  brain]
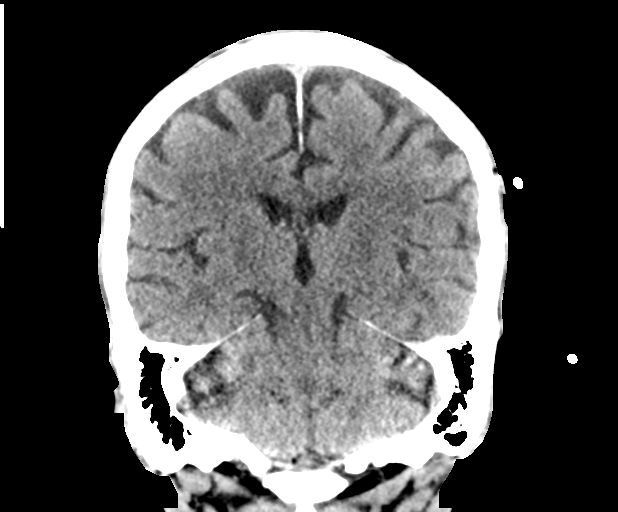

[Series 6: head without sag · sagittal · non-contrast · 0.37mm/px · 3 of 67 slices shown]
[im 23/67  brain]
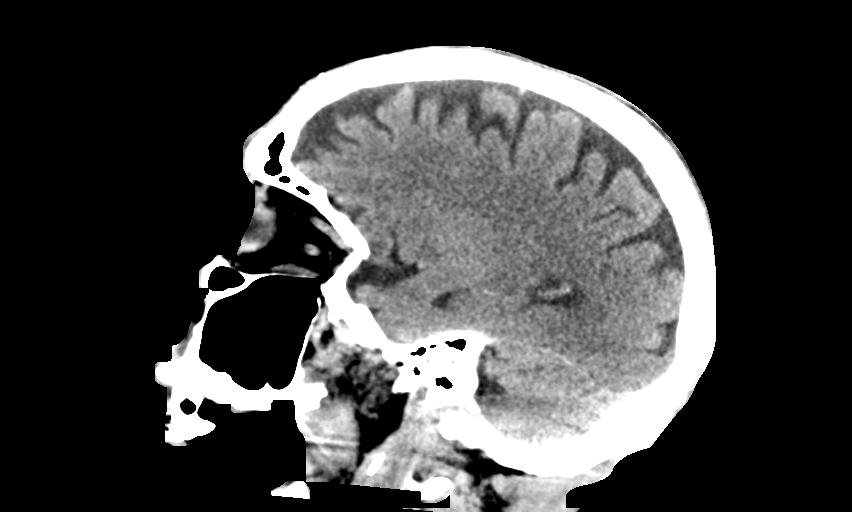
[im 34/67  brain]
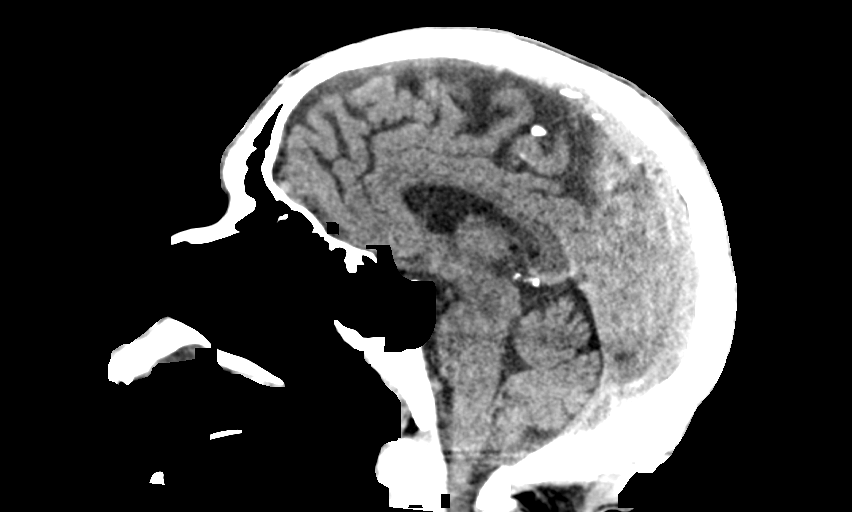
[im 45/67  brain]
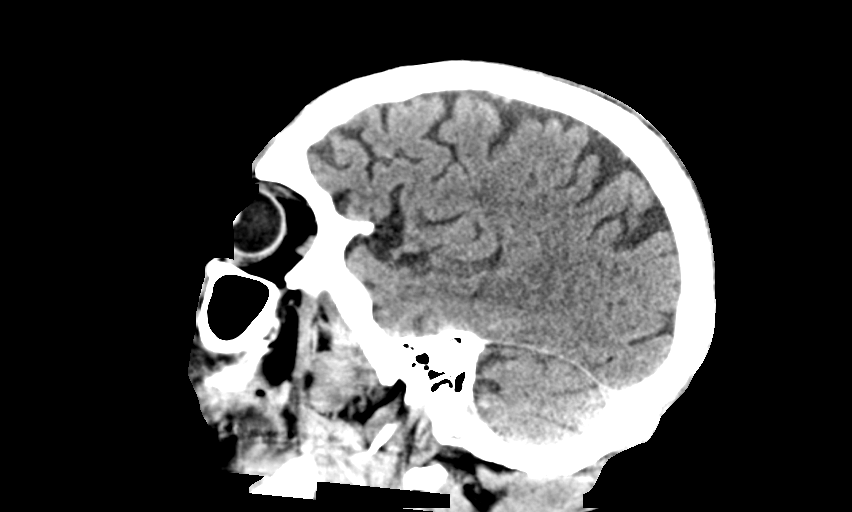

[15 of 47 positions shown; findings below may reference images not displayed]

FINDINGS: Brain: There is no mass, hemorrhage or extra-axial collection. The
size and configuration of the ventricles and extra-axial CSF spaces
are normal. The brain parenchyma is normal, without evidence of
acute or chronic infarction. There is bilateral basal ganglia
mineralization.

Vascular: No abnormal hyperdensity of the major intracranial
arteries or dural venous sinuses. No intracranial atherosclerosis.

Skull: The visualized skull base, calvarium and extracranial soft
tissues are normal.

Sinuses/Orbits: No fluid levels or advanced mucosal thickening of
the visualized paranasal sinuses. No mastoid or middle ear effusion.
The orbits are normal.
IMPRESSION: No acute intracranial abnormality.

## 2019-10-21 IMAGING — DX DG CHEST 1V PORT
1 series · 1 of 1 positions shown · non-contrast
Comparison: 03/24/2018 and 03/18/2018

CLINICAL DATA: Acute respiratory failure. Intubation.

EXAM:
PORTABLE CHEST 1 VIEW

[chest]
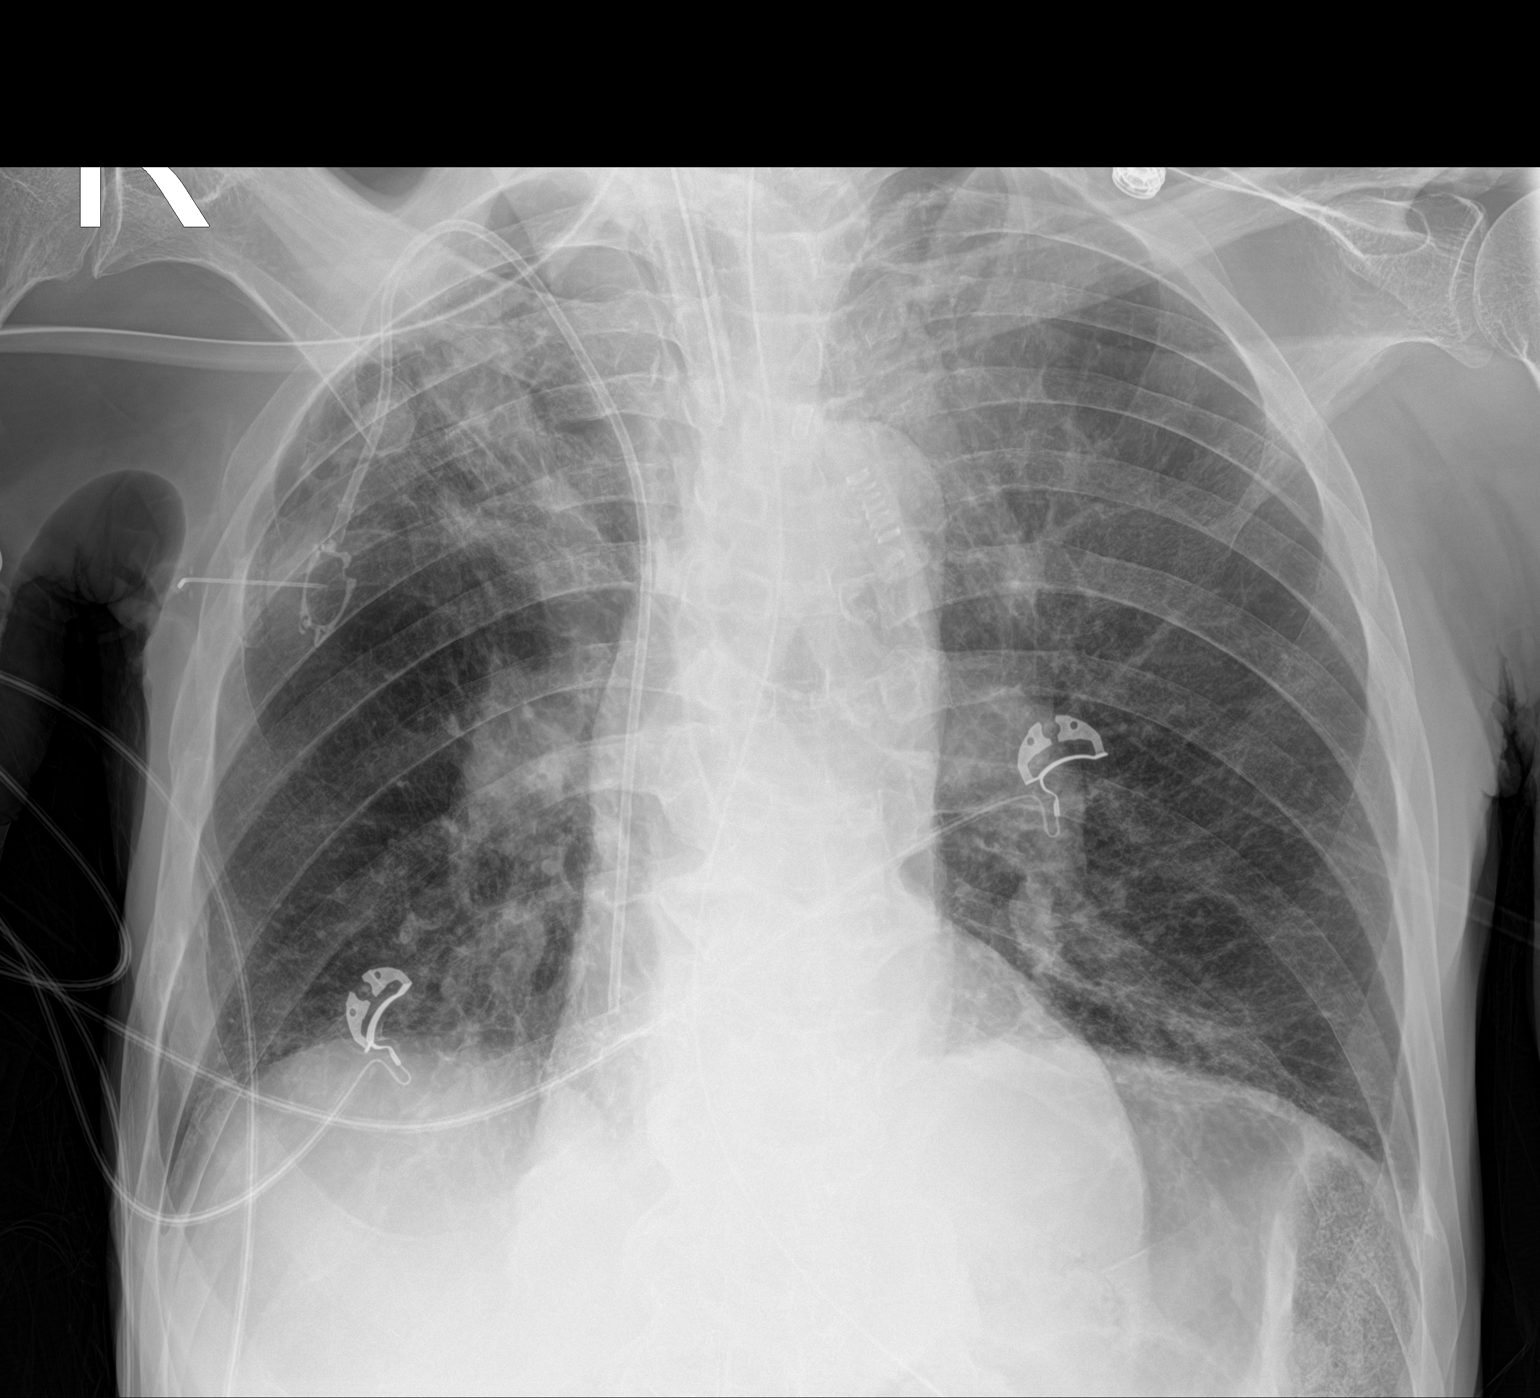

[1 of 1 positions shown; findings below may reference images not displayed]

FINDINGS: Endotracheal tube in good position 5.4 cm above the carina.
Port-A-Cath tip at the cavoatrial junction. NG tube tip below the
diaphragm.

Heart size is normal. Chronic prominence of the main pulmonary
arteries. Chronic accentuation of the interstitial markings at the
bases. Unchanged inflammatory disease in the right upper lobe with
soft tissue nodules in blebs demonstrated on the prior CT scan.

No significant change since the prior study.
IMPRESSION: Endotracheal tube in good position. No change since the prior study.
